# Patient Record
Sex: Female | Born: 1990 | Race: White | Hispanic: No | Marital: Single | Smoking: Never smoker
Health system: Southern US, Community
[De-identification: ages and names within clinical notes are randomized; demographics above are authoritative.]

## PROBLEM LIST (undated history)

## (undated) ENCOUNTER — Inpatient Hospital Stay (HOSPITAL_COMMUNITY): Payer: Self-pay

## (undated) DIAGNOSIS — N809 Endometriosis, unspecified: Secondary | ICD-10-CM

## (undated) DIAGNOSIS — K589 Irritable bowel syndrome without diarrhea: Secondary | ICD-10-CM

## (undated) DIAGNOSIS — J45909 Unspecified asthma, uncomplicated: Secondary | ICD-10-CM

## (undated) HISTORY — PX: FIRST RIB REMOVAL: SHX642

## (undated) HISTORY — PX: TONSILLECTOMY: SUR1361

## (undated) HISTORY — PX: OVARIAN CYST REMOVAL: SHX89

---
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
## 2012-04-08 ENCOUNTER — Emergency Department: Payer: Self-pay | Admitting: Emergency Medicine

## 2012-04-08 LAB — BASIC METABOLIC PANEL
Anion Gap: 9 (ref 7–16)
BUN: 8 mg/dL — ABNORMAL LOW (ref 9–21)
Co2: 27 mmol/L — ABNORMAL HIGH (ref 16–25)
EGFR (Non-African Amer.): >60
Potassium: 3.5 mmol/L (ref 3.3–4.7)

## 2012-04-08 LAB — URINALYSIS, COMPLETE
Bilirubin,UR: NEGATIVE
Blood: NEGATIVE
Glucose,UR: NEGATIVE mg/dL
Ketone: NEGATIVE
Nitrite: NEGATIVE
Ph: 6
Protein: NEGATIVE
RBC,UR: 2 /HPF
Specific Gravity: 1.02
Squamous Epithelial: 21
WBC UR: 25 /HPF

## 2012-04-08 LAB — CBC
HCT: 37.9 %
HGB: 12.8 g/dL
MCH: 30.7 pg
MCHC: 33.8 g/dL
MCV: 91 fL
Platelet: 181 x10 3/mm 3
RBC: 4.17 X10 6/mm 3
RDW: 12.6 %
WBC: 9 x10 3/mm 3

## 2012-04-08 LAB — HCG, QUANTITATIVE, PREGNANCY: Beta Hcg, Quant.: 69876 m[IU]/mL — ABNORMAL HIGH

## 2012-06-12 ENCOUNTER — Encounter (HOSPITAL_COMMUNITY): Payer: Self-pay | Admitting: *Deleted

## 2012-06-12 ENCOUNTER — Inpatient Hospital Stay (HOSPITAL_COMMUNITY)
Admission: AD | Admit: 2012-06-12 | Discharge: 2012-06-12 | Disposition: A | Discharge status: Home / Self Care | Payer: Medicaid Other | Source: Ambulatory Visit | Attending: Obstetrics | Admitting: Obstetrics

## 2012-06-12 DIAGNOSIS — R300 Dysuria: Secondary | ICD-10-CM | POA: Insufficient documentation

## 2012-06-12 DIAGNOSIS — N949 Unspecified condition associated with female genital organs and menstrual cycle: Secondary | ICD-10-CM

## 2012-06-12 DIAGNOSIS — H9209 Otalgia, unspecified ear: Secondary | ICD-10-CM | POA: Insufficient documentation

## 2012-06-12 DIAGNOSIS — R109 Unspecified abdominal pain: Secondary | ICD-10-CM | POA: Insufficient documentation

## 2012-06-12 DIAGNOSIS — R509 Fever, unspecified: Secondary | ICD-10-CM | POA: Insufficient documentation

## 2012-06-12 DIAGNOSIS — O99891 Other specified diseases and conditions complicating pregnancy: Secondary | ICD-10-CM | POA: Insufficient documentation

## 2012-06-12 LAB — URINALYSIS, ROUTINE W REFLEX MICROSCOPIC
Bilirubin Urine: NEGATIVE
Hgb urine dipstick: NEGATIVE
Ketones, ur: NEGATIVE mg/dL
Nitrite: NEGATIVE
Urobilinogen, UA: 0.2 mg/dL (ref 0.0–1.0)

## 2012-06-12 LAB — WET PREP, GENITAL

## 2012-06-12 NOTE — MAU Note (Signed)
Pt reports "i haven't felt good for the last few days", earache, headache, fever at times. Noticed today her feet are really swollen, b/p at home 160/80. Reports 4 contractions today.

## 2012-06-12 NOTE — MAU Provider Note (Signed)
History     CSN: [id number]  Arrival date & time 06/12/12  1947   None     Chief Complaint  Patient presents with  . Hypertension    HPI Gloria Valdez is a 21 y.o. female @ [redacted]w[redacted]d gestation who presents to MAU for lower abdominal cramping that started last night. Today felt like a couple contractions. Had fever earlier today 101.3 and felt nauseated and had low back pain. Frequent urination with pressure feeling. The patient's mother is concerned that the patient may have PIH because she thinks her feet have had some swelling. The history was provided by the patient and her mother.  History reviewed. No pertinent past medical history.  Past Surgical History  Procedure Date  . Tonsillectomy     History reviewed. No pertinent family history.  History  Substance Use Topics  . Smoking status: Never Smoker   . Smokeless tobacco: Not on file  . Alcohol Use: No    OB History    Grav Para Term Preterm Abortions TAB SAB Ect Mult Living   1               Review of Systems  Constitutional: Positive for fever and appetite change. Negative for chills, diaphoresis and fatigue.  HENT: Positive for ear pain. Negative for congestion, sore throat, facial swelling, neck pain, neck stiffness, dental problem and sinus pressure.   Eyes: Negative for photophobia, pain, discharge and visual disturbance.  Respiratory: Negative for cough, chest tightness and wheezing.   Cardiovascular: Negative for chest pain and palpitations.  Gastrointestinal: Positive for nausea, abdominal pain and constipation. Negative for vomiting, diarrhea and abdominal distention.  Genitourinary: Positive for vaginal discharge. Negative for dysuria, frequency, flank pain, vaginal bleeding and difficulty urinating.  Musculoskeletal: Positive for back pain. Negative for myalgias and gait problem.  Skin: Negative for color change and rash.  Neurological: Positive for light-headedness and headaches. Negative for  dizziness, speech difficulty, weakness and numbness.  Psychiatric/Behavioral: Negative for confusion and agitation. The patient is not nervous/anxious.     Allergies  Cinnamon  Home Medications  No current outpatient prescriptions on file.  BP 109/73  Pulse 76  Temp 99.4 F (37.4 C) (Oral)  Resp 20  Ht 5' 9.5" (1.765 m)  Wt 167 lb (75.751 kg)  BMI 24.31 kg/m2  SpO2 100%  Physical Exam  Nursing note and vitals reviewed. Constitutional: She is oriented to person, place, and time. Vital signs are normal. She appears well-developed and well-nourished. No distress.  HENT:  Head: Normocephalic.  Right Ear: External ear normal.  Left Ear: External ear normal.  Mouth/Throat: Oropharynx is clear and moist.       TM's normal bilateral.  Eyes: EOM are normal.  Neck: Neck supple.  Cardiovascular: Normal rate and regular rhythm.   Pulmonary/Chest: Effort normal and breath sounds normal.  Abdominal: Soft. Bowel sounds are normal. There is tenderness in the suprapubic area. There is no rigidity, no rebound, no guarding and no CVA tenderness.       Gravid at [redacted] weeks gestation.  Genitourinary:       External genitalia without lesions. White discharge vaginal vault. Cervix closed, thick, high. Uterus consistent with dates.  Musculoskeletal: Normal range of motion. She exhibits no edema.  Neurological: She is alert and oriented to person, place, and time. She has normal strength. No cranial nerve deficit.  Skin: Skin is warm and dry.  Psychiatric: She has a normal mood and affect. Her behavior is normal. Judgment  and thought content normal.   Results for orders placed during the hospital encounter of 06/12/12 (from the past 24 hour(s))  URINALYSIS, ROUTINE W REFLEX MICROSCOPIC     Status: Normal   Collection Time   06/12/12  8:05 PM      Component Value Range   Color, Urine YELLOW  YELLOW   APPearance CLEAR  CLEAR   Specific Gravity, Urine 1.015  1.005 - 1.030   pH 6.0  5.0 - 8.0    Glucose, UA NEGATIVE  NEGATIVE mg/dL   Hgb urine dipstick NEGATIVE  NEGATIVE   Bilirubin Urine NEGATIVE  NEGATIVE   Ketones, ur NEGATIVE  NEGATIVE mg/dL   Protein, ur NEGATIVE  NEGATIVE mg/dL   Urobilinogen, UA 0.2  0.0 - 1.0 mg/dL   Nitrite NEGATIVE  NEGATIVE   Leukocytes, UA NEGATIVE  NEGATIVE   EFM; Base line FH 130 bpm No contractions, no decelerations, normal for gestational age ED Course  Procedures  Assessment: Round ligament pain   Fever by history ? Viral illness   Pressure with urination   Ear pain ? TMJ  Plan:  Tylenol prn discomfort or fever   Urine sent for culture, GC, Chlamydia cultures sent   Call the office in am for follow up    I discussed with the patient and her mother that PIH is unlikely given her blood pressure of 109/64, no noted edema in the lower extremities and  no headache. Discussed urine results and culture sent. Discussed need for follow up in the office for further evaluation. Patient voices understanding.  MDM

## 2012-06-12 NOTE — Discharge Instructions (Signed)
Take tylenol as needed for discomfort. Call the office in the morning to schedule follow up. Return here as needed.

## 2012-06-12 NOTE — MAU Note (Signed)
Pt's mom states that she took pt's blood pressure and it was elevated. Pt states that she notice swelling in her feet.

## 2012-06-22 ENCOUNTER — Inpatient Hospital Stay (HOSPITAL_COMMUNITY)
Admission: AD | Admit: 2012-06-22 | Discharge: 2012-06-22 | Disposition: A | Discharge status: Home / Self Care | Payer: Medicaid Other | Source: Ambulatory Visit | Attending: Obstetrics & Gynecology | Admitting: Obstetrics & Gynecology

## 2012-06-22 ENCOUNTER — Encounter (HOSPITAL_COMMUNITY): Payer: Self-pay | Admitting: *Deleted

## 2012-06-22 DIAGNOSIS — O99891 Other specified diseases and conditions complicating pregnancy: Secondary | ICD-10-CM | POA: Insufficient documentation

## 2012-06-22 DIAGNOSIS — L259 Unspecified contact dermatitis, unspecified cause: Secondary | ICD-10-CM

## 2012-06-22 DIAGNOSIS — R21 Rash and other nonspecific skin eruption: Secondary | ICD-10-CM | POA: Insufficient documentation

## 2012-06-22 DIAGNOSIS — L239 Allergic contact dermatitis, unspecified cause: Secondary | ICD-10-CM

## 2012-06-22 DIAGNOSIS — Z3490 Encounter for supervision of normal pregnancy, unspecified, unspecified trimester: Secondary | ICD-10-CM

## 2012-06-22 MED ORDER — FLUTICASONE PROPIONATE 0.05 % EX CREA: TOPICAL_CREAM | Freq: Two times a day (BID) | CUTANEOUS | Status: DC

## 2012-06-22 NOTE — Discharge Instructions (Signed)

## 2012-06-22 NOTE — MAU Provider Note (Signed)
  History     CSN: [id number]  Arrival date and time: 06/22/12 1615   First Provider Initiated Contact with Patient 06/22/12 1938      Chief Complaint  Patient presents with  . Rash   Patient is a 21 y.o. female presenting with rash.  Rash  Associated symptoms include itching.  Gloria Valdez is a 21 y.o. G1P0 at [redacted]w[redacted]d. She had a rash on bil hips, saw Dr Clearance Coots, dx as fungus. Is using Lotrimin cream but the rash is getting worse. It itches all the time, is spreading. No new contacts with clothing, soaps, have washed all clothing again.     Past Medical History  Diagnosis Date  . No pertinent past medical history     Past Surgical History  Procedure Date  . Tonsillectomy   . First rib removal     History reviewed. No pertinent family history.  History  Substance Use Topics  . Smoking status: Never Smoker   . Smokeless tobacco: Not on file  . Alcohol Use: No    Allergies:  Allergies  Allergen Reactions  . Cinnamon Hives, Itching and Swelling    Prescriptions prior to admission  Medication Sig Dispense Refill  . acetaminophen (TYLENOL) 325 MG tablet Take 325 mg by mouth every 6 (six) hours as needed. For headache or pain      . clotrimazole (LOTRIMIN) 1 % cream Apply 1 application topically 2 (two) times daily.      . Prenatal Vit-Fe Fumarate-FA (PRENATAL MULTIVITAMIN) TABS Take 1 tablet by mouth at bedtime.        Review of Systems  Constitutional: Negative for fever and chills.  Gastrointestinal: Negative for abdominal pain.  Genitourinary: Negative for dysuria, urgency and frequency.       Denies bleeding, leaking. Good fetal activity  Skin: Positive for itching and rash.   Physical Exam   Blood pressure 111/59, pulse 62, temperature 98.8 F (37.1 C), temperature source Oral, resp. rate 18, height 5\' 9"  (1.753 m), weight 168 lb 9.6 oz (76.476 kg).  Physical Exam  Constitutional: She is oriented to person, place, and time. She appears  well-developed and well-nourished. No distress.  Musculoskeletal: Normal range of motion.  Neurological: She is alert and oriented to person, place, and time.  Skin:       bil hips erythematous raised, warm, irreg borders with scattered satellite spots on abd.  Psychiatric: She has a normal mood and affect. Her behavior is normal.    MAU Course  Procedures  MDM allergic dermatitis Evaluated with Kerrie Buffalo, NP  Assessment and Plan  Rx Cutivate 3-4x/day Benadryl po, cool soaks Call the office for f/u if no improvement  [place] Korver M. 06/22/2012, 7:49 PM

## 2012-06-22 NOTE — MAU Note (Signed)
Pt rpeorts she had this rash that started on her left flank on Wed. Went to Doctor and was given a crea. Rash has spreat to right flank and some on her abd as well. Rash is very itchy and burning.

## 2012-07-02 ENCOUNTER — Other Ambulatory Visit: Payer: Self-pay | Admitting: Obstetrics & Gynecology

## 2012-07-02 LAB — OB RESULTS CONSOLE HIV ANTIBODY (ROUTINE TESTING): HIV: NONREACTIVE

## 2012-07-02 LAB — OB RESULTS CONSOLE GC/CHLAMYDIA: Gonorrhea: NEGATIVE

## 2012-07-02 LAB — OB RESULTS CONSOLE ABO/RH: RH Type: NEGATIVE

## 2012-07-02 LAB — OB RESULTS CONSOLE RPR: RPR: NONREACTIVE

## 2012-07-17 ENCOUNTER — Inpatient Hospital Stay (HOSPITAL_COMMUNITY)
Admission: AD | Admit: 2012-07-17 | Discharge: 2012-07-17 | Disposition: A | Discharge status: Home / Self Care | Payer: Medicaid Other | Source: Ambulatory Visit | Attending: Obstetrics | Admitting: Obstetrics

## 2012-07-17 DIAGNOSIS — Z3480 Encounter for supervision of other normal pregnancy, unspecified trimester: Secondary | ICD-10-CM | POA: Insufficient documentation

## 2012-07-17 DIAGNOSIS — Z298 Encounter for other specified prophylactic measures: Secondary | ICD-10-CM | POA: Insufficient documentation

## 2012-07-17 DIAGNOSIS — Z2989 Encounter for other specified prophylactic measures: Secondary | ICD-10-CM | POA: Insufficient documentation

## 2012-07-17 LAB — ABO/RH: ABO/RH(D): A NEG

## 2012-07-17 MED ORDER — RHO D IMMUNE GLOBULIN 1500 UNIT/2ML IJ SOLN
300.0000 ug | Freq: Once | INTRAMUSCULAR | Status: AC
  Administered 2012-07-17: 300 ug via INTRAMUSCULAR
  Filled 2012-07-17: qty 2

## 2012-07-17 NOTE — MAU Note (Signed)
Rhophylac information booklet given to the patient while in the lobby. Explained the 1 1/2 hours required to process this order.Patient verbalizes understanding of instructions.

## 2012-07-18 LAB — RH IG WORKUP (INCLUDES ABO/RH)
Antibody Screen: NEGATIVE
Fetal Screen: NEGATIVE
Gestational Age(Wks): 28

## 2012-08-16 ENCOUNTER — Encounter (HOSPITAL_COMMUNITY): Payer: Self-pay | Admitting: *Deleted

## 2012-08-16 ENCOUNTER — Inpatient Hospital Stay (HOSPITAL_COMMUNITY)
Admission: AD | Admit: 2012-08-16 | Discharge: 2012-08-17 | Disposition: A | Discharge status: Home / Self Care | Payer: Medicaid Other | Source: Ambulatory Visit | Attending: Obstetrics | Admitting: Obstetrics

## 2012-08-16 DIAGNOSIS — M549 Dorsalgia, unspecified: Secondary | ICD-10-CM | POA: Insufficient documentation

## 2012-08-16 DIAGNOSIS — O26859 Spotting complicating pregnancy, unspecified trimester: Secondary | ICD-10-CM | POA: Insufficient documentation

## 2012-08-16 DIAGNOSIS — O99891 Other specified diseases and conditions complicating pregnancy: Secondary | ICD-10-CM | POA: Insufficient documentation

## 2012-08-16 DIAGNOSIS — R42 Dizziness and giddiness: Secondary | ICD-10-CM | POA: Insufficient documentation

## 2012-08-16 DIAGNOSIS — R109 Unspecified abdominal pain: Secondary | ICD-10-CM | POA: Insufficient documentation

## 2012-08-16 LAB — URINALYSIS, ROUTINE W REFLEX MICROSCOPIC
Glucose, UA: NEGATIVE mg/dL
Ketones, ur: NEGATIVE mg/dL
Leukocytes, UA: NEGATIVE
Nitrite: NEGATIVE
Protein, ur: NEGATIVE mg/dL
pH: 7 (ref 5.0–8.0)

## 2012-08-16 LAB — WET PREP, GENITAL: Yeast Wet Prep HPF POC: NONE SEEN

## 2012-08-16 MED ORDER — ACETAMINOPHEN 325 MG PO TABS
650.0000 mg | ORAL_TABLET | Freq: Four times a day (QID) | ORAL | Status: DC | PRN
  Administered 2012-08-16: 650 mg via ORAL
  Filled 2012-08-16: qty 2

## 2012-08-16 NOTE — MAU Note (Signed)
About 2030 started having contractions about 10-5mins apart. Saw pink on tissue one time when wiped. Some back pain and abdominal cramping. Currently on PCN for strep throat since Monday.

## 2012-08-16 NOTE — MAU Provider Note (Signed)
Gloria Valdez is a 21 y.o. female presenting for eval of multiple complaints including spotting with wiping today, short of breath, dizzy, back pain, and cramping. Denies leaking of fluid. Denies dysuria.  Has multiple family members in room. History OB History    Grav Para Term Preterm Abortions TAB SAB Ect Mult Living   1              Past Medical History  Diagnosis Date  . No pertinent past medical history    Past Surgical History  Procedure Date  . Tonsillectomy   . First rib removal    Family History: family history is negative for Other. Social History:  reports that she has never smoked. She does not have any smokeless tobacco history on file. She reports that she does not drink alcohol or use illicit drugs.    ROS  Dilation: Closed Exam by:: Philipp Deputy CNM Blood pressure 119/79, pulse 95, temperature 98.1 F (36.7 C), resp. rate 20, height 5\' 9"  (1.753 m), weight 79.379 kg (175 lb). SaO2 100% Maternal Exam:  Uterine Assessment: One ctx in 1 hr of monitoring  Cervix: Very post- pt needed to sit on fists- C/L  Fetal Exam Fetal Monitor Review: Baseline rate: 145.  Variability: moderate (6-25 bpm).   Pattern: accelerations present and no decelerations.    Fetal State Assessment: Category I - tracings are normal.     Physical Exam  Constitutional: She is oriented to person, place, and time. She appears well-developed and well-nourished.  HENT:  Head: Normocephalic.  Cardiovascular: Normal rate.   Respiratory: Effort normal.  Genitourinary: Vagina normal.       sm white d/c seen with SE; no blood/pink/brown visualized  Musculoskeletal: Normal range of motion.  Neurological: She is alert and oriented to person, place, and time.  Skin: Skin is warm and dry.  Psychiatric: She has a normal mood and affect. Her behavior is normal. Thought content normal.    Urinalysis    Component Value Date/Time   COLORURINE YELLOW 08/16/2012 2233   APPEARANCEUR CLEAR  08/16/2012 2233   LABSPEC 1.010 08/16/2012 2233   PHURINE 7.0 08/16/2012 2233   GLUCOSEU NEGATIVE 08/16/2012 2233   HGBUR NEGATIVE 08/16/2012 2233   BILIRUBINUR NEGATIVE 08/16/2012 2233   KETONESUR NEGATIVE 08/16/2012 2233   PROTEINUR NEGATIVE 08/16/2012 2233   UROBILINOGEN 0.2 08/16/2012 2233   NITRITE NEGATIVE 08/16/2012 2233   LEUKOCYTESUR NEGATIVE 08/16/2012 2233   Microscopic wet-mount exam shows negative for pathogens, normal epithelial cells, white blood cells.  Prenatal labs: ABO, Rh: --/--/A NEG, A NEG (07/25 1000) Antibody: NEG (07/25 1000) Rubella:   RPR:    HBsAg:    HIV:    GBS:     Assessment/Plan: IUP at 32.2 Discomforts of late preg  D/C home with preterm labor precautions F/U as scheduled or sooner prn    Cam Hai 08/16/2012, 11:43 PM

## 2012-08-16 NOTE — Progress Notes (Signed)
Wet prep obtained.   

## 2012-08-17 DIAGNOSIS — R42 Dizziness and giddiness: Secondary | ICD-10-CM

## 2012-08-17 DIAGNOSIS — R109 Unspecified abdominal pain: Secondary | ICD-10-CM

## 2012-08-17 DIAGNOSIS — M549 Dorsalgia, unspecified: Secondary | ICD-10-CM

## 2012-08-17 NOTE — Progress Notes (Signed)
Philipp Deputy CNM in to see pt and efm d/ced

## 2012-08-17 NOTE — Progress Notes (Signed)
Written and verbal d/c instructions given and understanding voiced. 

## 2012-08-25 ENCOUNTER — Inpatient Hospital Stay (HOSPITAL_COMMUNITY)
Admit: 2012-08-25 | Discharge: 2012-08-25 | Disposition: A | Discharge status: Home / Self Care | Payer: Medicaid Other | Source: Ambulatory Visit | Attending: Obstetrics | Admitting: Obstetrics

## 2012-08-25 ENCOUNTER — Encounter (HOSPITAL_COMMUNITY): Payer: Self-pay

## 2012-08-25 DIAGNOSIS — R32 Unspecified urinary incontinence: Secondary | ICD-10-CM | POA: Insufficient documentation

## 2012-08-25 DIAGNOSIS — O479 False labor, unspecified: Secondary | ICD-10-CM

## 2012-08-25 DIAGNOSIS — O4700 False labor before 37 completed weeks of gestation, unspecified trimester: Secondary | ICD-10-CM | POA: Insufficient documentation

## 2012-08-25 DIAGNOSIS — O99891 Other specified diseases and conditions complicating pregnancy: Secondary | ICD-10-CM | POA: Insufficient documentation

## 2012-08-25 HISTORY — DX: Unspecified asthma, uncomplicated: J45.909

## 2012-08-25 NOTE — MAU Note (Signed)
Initial gush of white/clear fluid at 2021 tonight. Has been contracting some since then. Denies vaginal bleeding. Positive fetal movement.

## 2012-08-25 NOTE — MAU Provider Note (Signed)
History     CSN: [id number]  Arrival date and time: 08/25/12 2135   First Provider Initiated Contact with Patient 08/25/12 2219      Chief Complaint  Patient presents with  . Rupture of Membranes  . Contractions   HPI Gloria Valdez is a 21 y.o. female @ [redacted]w[redacted]d gestation who presents to MAU for rupture of membranes. She reports that she was helping her mother move some heavy boxes when she felt a gush of fluid and started having contractions. The fluid was clear. The patient states she tried to check her own cervix after the fluid. The history was provided by the patient.  OB History    Grav Para Term Preterm Abortions TAB SAB Ect Mult Living   3 0 0 0 2 0 2 0 0 0       Past Medical History  Diagnosis Date  . Asthma     Past Surgical History  Procedure Date  . Tonsillectomy   . First rib removal     Family History  Problem Relation Age of Onset  . Other Neg Hx     History  Substance Use Topics  . Smoking status: Never Smoker   . Smokeless tobacco: Not on file  . Alcohol Use: No    Allergies:  Allergies  Allergen Reactions  . Bee Venom Anaphylaxis  . Cinnamon Hives, Itching and Swelling  . Demerol (Meperidine) Itching  . Phenergan (Promethazine Hcl) Nausea Only  . Strawberry Itching and Swelling    Prescriptions prior to admission  Medication Sig Dispense Refill  . acetaminophen (TYLENOL) 325 MG tablet Take 325 mg by mouth every 6 (six) hours as needed. For headache or pain      . Prenatal Vit-Fe Fumarate-FA (PRENATAL MULTIVITAMIN) TABS Take 1 tablet by mouth at bedtime.        Review of Systems  Constitutional: Negative for fever, chills and weight loss.  HENT: Negative for ear pain, nosebleeds, congestion, sore throat and neck pain.   Eyes: Negative for blurred vision, double vision, photophobia and pain.  Respiratory: Negative for cough, shortness of breath and wheezing.   Cardiovascular: Negative for chest pain, palpitations and leg swelling.    Gastrointestinal: Positive for abdominal pain. Negative for heartburn, nausea, vomiting, diarrhea and constipation.  Genitourinary: Negative for dysuria, urgency and frequency.       Leaking fluid from vagina  Musculoskeletal: Negative for myalgias and back pain.  Skin: Negative for itching and rash.  Neurological: Negative for dizziness, sensory change, speech change, seizures, weakness and headaches.  Endo/Heme/Allergies: Does not bruise/bleed easily.  Psychiatric/Behavioral: Negative for depression. The patient is not nervous/anxious.    Physical Exam   Blood pressure 127/75, pulse 74, temperature 98.3 F (36.8 C), temperature source Oral, resp. rate 18, height 5' 9.02" (1.753 m), weight 179 lb 3.2 oz (81.285 kg).  Physical Exam  Nursing note and vitals reviewed. Constitutional: She is oriented to person, place, and time. She appears well-developed and well-nourished.  HENT:  Head: Normocephalic.  Cardiovascular: Normal rate.   Respiratory: Effort normal.  GI: Soft. There is tenderness. There is no rebound and no guarding.       Tenderness is mild in lower abdomen. Positive FHT.  Genitourinary:       External genitalia without lesions. White discharge vaginal vault. No pooling noted. Cervix closed, thick, posterior. Uterus consistent with dates.  Musculoskeletal: Normal range of motion.  Neurological: She is alert and oriented to person, place, and time.  Skin: Skin  is warm and dry.  Psychiatric: She has a normal mood and affect. Her behavior is normal. Judgment and thought content normal.   Results for orders placed during the hospital encounter of 08/25/12 (from the past 24 hour(s))  AMNISURE RUPTURE OF MEMBRANE (ROM)     Status: Normal   Collection Time   08/25/12 10:36 PM      Component Value Range   Amnisure ROM NEGATIVE    POCT FERN TEST     Status: Normal   Collection Time   08/25/12 10:38 PM      Component Value Range   Fern Test Negative     EFM: Baseline 150,  moderate variability, no decelerations, irregular contractions. Procedures  Discussed with DR. Clearance Coots and will d/c patient home to follow up in the office.   Assessment: Urinary incontinent   False labor  Plan:  Home, rest, no heavy lifting   Follow up in the office tomorrow as scheduled   Return  Here as needed Discussed with the patient and all questioned fully answered. She will follow up in the office or return here if any problems arise. Medication List  As of 08/26/2012  1:21 AM   CONTINUE taking these medications         acetaminophen 325 MG tablet   Commonly known as: TYLENOL      prenatal multivitamin Tabs           Follow-up Information    Follow up with Brock Bad, MD.   Contact information:   [address] [place] [address] [phone]         Kerrie Buffalo, RN, FNP, [facility] 08/25/2012, 10:40 PM

## 2012-08-25 NOTE — MAU Note (Signed)
Outside having cramping, back pain,  Then noticed fluid leaking.

## 2012-09-09 LAB — OB RESULTS CONSOLE GBS: GBS: NEGATIVE

## 2012-09-18 ENCOUNTER — Encounter (HOSPITAL_COMMUNITY): Payer: Self-pay | Admitting: *Deleted

## 2012-09-18 ENCOUNTER — Inpatient Hospital Stay (HOSPITAL_COMMUNITY)
Admission: AD | Admit: 2012-09-18 | Discharge: 2012-09-19 | Disposition: A | Discharge status: Home / Self Care | Payer: Medicaid Other | Source: Ambulatory Visit | Attending: Obstetrics | Admitting: Obstetrics

## 2012-09-18 DIAGNOSIS — B3731 Acute candidiasis of vulva and vagina: Secondary | ICD-10-CM | POA: Insufficient documentation

## 2012-09-18 DIAGNOSIS — O2390 Unspecified genitourinary tract infection in pregnancy, unspecified trimester: Secondary | ICD-10-CM | POA: Insufficient documentation

## 2012-09-18 DIAGNOSIS — A084 Viral intestinal infection, unspecified: Secondary | ICD-10-CM

## 2012-09-18 DIAGNOSIS — A088 Other specified intestinal infections: Secondary | ICD-10-CM | POA: Insufficient documentation

## 2012-09-18 DIAGNOSIS — B373 Candidiasis of vulva and vagina: Secondary | ICD-10-CM | POA: Insufficient documentation

## 2012-09-18 DIAGNOSIS — O99891 Other specified diseases and conditions complicating pregnancy: Secondary | ICD-10-CM | POA: Insufficient documentation

## 2012-09-18 DIAGNOSIS — O212 Late vomiting of pregnancy: Secondary | ICD-10-CM | POA: Insufficient documentation

## 2012-09-18 LAB — URINE MICROSCOPIC-ADD ON

## 2012-09-18 LAB — URINALYSIS, ROUTINE W REFLEX MICROSCOPIC
Glucose, UA: NEGATIVE mg/dL
Hgb urine dipstick: NEGATIVE
Protein, ur: NEGATIVE mg/dL
pH: 7 (ref 5.0–8.0)

## 2012-09-18 MED ORDER — ONDANSETRON HCL 8 MG PO TABS: 8.0000 mg | ORAL_TABLET | Freq: Three times a day (TID) | ORAL | Status: DC | PRN

## 2012-09-18 MED ORDER — ONDANSETRON HCL 4 MG PO TABS
8.0000 mg | ORAL_TABLET | ORAL | Status: AC
  Administered 2012-09-18: 8 mg via ORAL
  Filled 2012-09-18 (×2): qty 1

## 2012-09-18 MED ORDER — FLUCONAZOLE 150 MG PO TABS
150.0000 mg | ORAL_TABLET | ORAL | Status: AC
  Administered 2012-09-18: 150 mg via ORAL
  Filled 2012-09-18: qty 1

## 2012-09-18 MED ORDER — FLUCONAZOLE 150 MG PO TABS: ORAL_TABLET | ORAL | Status: DC

## 2012-09-18 NOTE — MAU Note (Signed)
Pt reports vomiting (liquid and food) x 1 week. Diarrhea. Thinks she may have had a fever. Headache, dizziness. Difficulty breathing "at times" and "some mild angina".

## 2012-09-18 NOTE — MAU Note (Signed)
Pt states she has been having nausea and vomiting as well as diarrhea since Thursday 09/11/2012. Pt complaints of not being able "keep anything down " pt states she is having hot and cold flashes as well. Pt states she has had a bowel movement today that contained mucous.

## 2012-09-18 NOTE — Progress Notes (Signed)
Last episode of diarrhea yesterday at 1600

## 2012-09-18 NOTE — MAU Provider Note (Signed)
Chief Complaint:  Nausea and Emesis   None     HPI: Gloria Valdez is a 21 y.o. G3P0020 at 72w0dwho presents to maternity admissions reporting n/v and diarrhea x1 week.  No diarrhea today but pt vomited x3.  She ate some cookie dough the day her diarrhea started and is concerned about food poisoning.  While in MAU, she reports some leakage of fluid when she went to the bathroom.  She also reports some vaginal itching in the last 2-3 days.  She reports good fetal movement, vaginal bleeding, vaginal itching/burning, urinary symptoms, h/a, dizziness, or fever/chills.     Past Medical History: Past Medical History  Diagnosis Date  . Asthma     Past obstetric history:  Past Surgical History: Past Surgical History  Procedure Date  . Tonsillectomy   . First rib removal     Family History: Family History  Problem Relation Age of Onset  . Other Neg Hx   . Diabetes Mother   . Hypertension Father   . Asthma Father     Social History: History  Substance Use Topics  . Smoking status: Never Smoker   . Smokeless tobacco: Not on file  . Alcohol Use: No    Allergies:  Allergies  Allergen Reactions  . Bee Venom Anaphylaxis  . Cinnamon Hives, Itching and Swelling  . Demerol (Meperidine) Itching  . Phenergan (Promethazine Hcl) Nausea Only  . Strawberry Itching and Swelling    Meds:  Prescriptions prior to admission  Medication Sig Dispense Refill  . acetaminophen (TYLENOL) 325 MG tablet Take 325 mg by mouth every 6 (six) hours as needed. For headache or pain      . Prenatal Vit-Fe Fumarate-FA (PRENATAL MULTIVITAMIN) TABS Take 1 tablet by mouth at bedtime.      Marland Kitchen DISCONTD: Ondansetron (ZOFRAN ODT PO) Take 1 tablet by mouth every 8 (eight) hours as needed. For nausea        ROS: Pertinent findings in history of present illness.  Physical Exam  Blood pressure 138/85, pulse 77, temperature 98.3 F (36.8 C), temperature source Oral, resp. rate 18, height 5\' 9"  (1.753 m),  weight 81.194 kg (179 lb), SpO2 100.00%. GENERAL: Well-developed, well-nourished female in no acute distress.  HEENT: normocephalic HEART: normal rate RESP: normal effort ABDOMEN: Soft, non-tender, gravid appropriate for gestational age EXTREMITIES: Nontender, no edema NEURO: alert and oriented Pelvic exam: Cervix pink, visually closed, without lesion, large amount white clumpy vaginal discharge, vaginal walls and external genitalia normal  Cervix FT/50%/-3, posterior, soft    FHT:  Baseline 135 , moderate variability, accelerations present, no decelerations Contractions: irregular, 2-10 minutes   Labs: Results for orders placed during the hospital encounter of 09/18/12 (from the past 24 hour(s))  URINALYSIS, ROUTINE W REFLEX MICROSCOPIC     Status: Abnormal   Collection Time   09/18/12  8:10 PM      Component Value Range   Color, Urine YELLOW  YELLOW   APPearance HAZY (*) CLEAR   Specific Gravity, Urine <1.005 (*) 1.005 - 1.030   pH 7.0  5.0 - 8.0   Glucose, UA NEGATIVE  NEGATIVE mg/dL   Hgb urine dipstick NEGATIVE  NEGATIVE   Bilirubin Urine NEGATIVE  NEGATIVE   Ketones, ur NEGATIVE  NEGATIVE mg/dL   Protein, ur NEGATIVE  NEGATIVE mg/dL   Urobilinogen, UA 1.0  0.0 - 1.0 mg/dL   Nitrite NEGATIVE  NEGATIVE   Leukocytes, UA SMALL (*) NEGATIVE  URINE MICROSCOPIC-ADD ON  Status: Abnormal   Collection Time   09/18/12  8:10 PM      Component Value Range   Squamous Epithelial / LPF FEW (*) RARE   WBC, UA 0-2  <3 WBC/hpf   Bacteria, UA FEW (*) RARE    Assessment: 1. Vaginal candida   2. Viral gastroenteritis     Plan: Diflucan 150 mg x1 dose now, 1 dose prescribed for 2 days from now Discharge home Labor precautions and fetal kick counts Urine sent for culture Zofran 8 mg PO tabs 1 tab Q8 hours PRN F/U with Dr Clearance Coots Return to MAU as needed  Follow-up Information    Follow up with HARPER,CHARLES A, MD. (Return to MAU as needed.)    Contact information:   [address] [place] [address] [phone]           Medication List     As of 09/18/2012 11:23 PM    STOP taking these medications         ZOFRAN ODT PO      TAKE these medications         acetaminophen 325 MG tablet   Commonly known as: TYLENOL   Take 325 mg by mouth every 6 (six) hours as needed. For headache or pain      fluconazole 150 MG tablet   Commonly known as: DIFLUCAN   Take one tablet by mouth on Sunday, 2 days after your dose in MAU.      ondansetron 8 MG tablet   Commonly known as: ZOFRAN   Take 1 tablet (8 mg total) by mouth every 8 (eight) hours as needed for nausea.      prenatal multivitamin Tabs   Take 1 tablet by mouth at bedtime.        Sharen Counter Certified Nurse-Midwife 09/18/2012 11:23 PM

## 2012-09-21 LAB — URINE CULTURE: Colony Count: 45000

## 2012-09-28 ENCOUNTER — Inpatient Hospital Stay (HOSPITAL_COMMUNITY): Payer: Medicaid Other

## 2012-09-28 ENCOUNTER — Encounter (HOSPITAL_COMMUNITY): Payer: Self-pay | Admitting: Obstetrics and Gynecology

## 2012-09-28 ENCOUNTER — Inpatient Hospital Stay (HOSPITAL_COMMUNITY)
Admission: AD | Admit: 2012-09-28 | Discharge: 2012-09-28 | Disposition: A | Discharge status: Home / Self Care | Payer: Medicaid Other | Source: Ambulatory Visit | Attending: Obstetrics & Gynecology | Admitting: Obstetrics & Gynecology

## 2012-09-28 DIAGNOSIS — O26899 Other specified pregnancy related conditions, unspecified trimester: Secondary | ICD-10-CM

## 2012-09-28 DIAGNOSIS — O1200 Gestational edema, unspecified trimester: Secondary | ICD-10-CM

## 2012-09-28 DIAGNOSIS — L299 Pruritus, unspecified: Secondary | ICD-10-CM | POA: Insufficient documentation

## 2012-09-28 DIAGNOSIS — O2686 Pruritic urticarial papules and plaques of pregnancy (PUPPP): Secondary | ICD-10-CM

## 2012-09-28 DIAGNOSIS — R0602 Shortness of breath: Secondary | ICD-10-CM

## 2012-09-28 DIAGNOSIS — L988 Other specified disorders of the skin and subcutaneous tissue: Secondary | ICD-10-CM

## 2012-09-28 DIAGNOSIS — O9989 Other specified diseases and conditions complicating pregnancy, childbirth and the puerperium: Secondary | ICD-10-CM | POA: Insufficient documentation

## 2012-09-28 LAB — COMPREHENSIVE METABOLIC PANEL
ALT: 6 U/L (ref 0–35)
AST: 9 U/L (ref 0–37)
Albumin: 2.5 g/dL — ABNORMAL LOW (ref 3.5–5.2)
Alkaline Phosphatase: 151 U/L — ABNORMAL HIGH (ref 39–117)
BUN: 14 mg/dL (ref 6–23)
CO2: 24 mEq/L (ref 19–32)
Calcium: 9.3 mg/dL (ref 8.4–10.5)
Chloride: 101 mEq/L (ref 96–112)
Creatinine, Ser: 0.82 mg/dL (ref 0.50–1.10)
GFR calc Af Amer: >90 mL/min (ref >90)
GFR calc non Af Amer: >90 mL/min (ref >90)
Glucose, Bld: 91 mg/dL (ref 70–99)
Potassium: 3.8 mEq/L (ref 3.5–5.1)
Sodium: 134 mEq/L — ABNORMAL LOW (ref 135–145)
Total Bilirubin: 0.8 mg/dL (ref 0.3–1.2)
Total Protein: 5.9 g/dL — ABNORMAL LOW (ref 6.0–8.3)

## 2012-09-28 LAB — CBC
HCT: 29.5 % — ABNORMAL LOW (ref 36.0–46.0)
Hemoglobin: 9.8 g/dL — ABNORMAL LOW (ref 12.0–15.0)
MCH: 28.2 pg (ref 26.0–34.0)
MCHC: 33.2 g/dL (ref 30.0–36.0)
MCV: 84.8 fL (ref 78.0–100.0)
Platelets: 203 10*3/uL (ref 150–400)
RBC: 3.48 MIL/uL — ABNORMAL LOW (ref 3.87–5.11)
RDW: 12.4 % (ref 11.5–15.5)
WBC: 10.7 10*3/uL — ABNORMAL HIGH (ref 4.0–10.5)

## 2012-09-28 LAB — URINE MICROSCOPIC-ADD ON

## 2012-09-28 LAB — URINALYSIS, ROUTINE W REFLEX MICROSCOPIC
Bilirubin Urine: NEGATIVE
Hgb urine dipstick: NEGATIVE
Nitrite: NEGATIVE
Protein, ur: NEGATIVE mg/dL
Specific Gravity, Urine: 1.015 (ref 1.005–1.030)
Urobilinogen, UA: 0.2 mg/dL (ref 0.0–1.0)

## 2012-09-28 MED ORDER — HYDROXYZINE PAMOATE 25 MG PO CAPS: 25.0000 mg | ORAL_CAPSULE | Freq: Three times a day (TID) | ORAL | Status: DC | PRN

## 2012-09-28 MED ORDER — HYDROXYZINE HCL 25 MG PO TABS
25.0000 mg | ORAL_TABLET | Freq: Once | ORAL | Status: AC
  Administered 2012-09-28: 25 mg via ORAL
  Filled 2012-09-28: qty 1

## 2012-09-28 NOTE — MAU Provider Note (Signed)
Chief Complaint:  Shortness of Breath, Pruritis and Edema   First Provider Initiated Contact with Patient 09/28/12 1812     HPI: Gloria Valdez is a 21 y.o. G3P0020 at 25w3dwho presents to maternity admissions reporting the following Sx since yesterday: 1. Swollen hands 2. Worsening of PUPPs rash.  3. SOB   Denies contractions, leakage of fluid, HA, epigastric pain, fever, cough, wheezing, cough, URI Sx, Allergy Sx or vaginal bleeding. Good fetal movement. Describes SOB as abrupt onset yesterday. Hx asthma as child. Does not feel the same. Taking Clotrimazole for PUPPs rash. Verified that pt did not mean to say Cortisone.   Past Medical History: Past Medical History  Diagnosis Date  . Asthma     Past obstetric history: OB History    Grav Para Term Preterm Abortions TAB SAB Ect Mult Living   3 0 0 0 2 0 2 0 0 0      # Outc Date GA Lbr Len/2nd Wgt Sex Del Anes PTL Lv   1 SAB            2 SAB            3 CUR               Past Surgical History: Past Surgical History  Procedure Date  . Tonsillectomy   . First rib removal     Family History: Family History  Problem Relation Age of Onset  . Other Neg Hx   . Diabetes Mother   . Hypertension Father   . Asthma Father     Social History: History  Substance Use Topics  . Smoking status: Never Smoker   . Smokeless tobacco: Not on file  . Alcohol Use: No    Allergies:  Allergies  Allergen Reactions  . Bee Venom Anaphylaxis  . Cinnamon Hives, Itching and Swelling  . Demerol (Meperidine) Itching  . Phenergan (Promethazine Hcl) Nausea Only  . Strawberry Itching and Swelling    Meds:  No prescriptions prior to admission    ROS: Pertinent findings in history of present illness.  Physical Exam  Blood pressure 114/87, pulse 96, temperature 97.8 F (36.6 C), temperature source Oral, resp. rate 18, height 5\' 9"  (1.753 m), weight 81.647 kg (180 lb), SpO2 99.00%. Patient Vitals for the past 24 hrs:  BP Temp Temp  src Pulse Resp SpO2 Height Weight  09/28/12 2131 114/87 mmHg - - 96  - - - -  09/28/12 1844 - - - 72  - 99 % - -  09/28/12 1839 - - - 77  - 100 % - -  09/28/12 1838 126/88 mmHg - - 71  - - - -  09/28/12 1834 - - - 73  - 99 % - -  09/28/12 1829 132/86 mmHg - - 82  - 100 % - -  09/28/12 1828 - - - 77  - - - -  09/28/12 1824 - - - 76  - 98 % - -  09/28/12 1820 - - - 90  - 100 % - -  09/28/12 1815 - - - 97  - 100 % - -  09/28/12 1810 - - - 103  - 99 % - -  09/28/12 1806 - - - 94  - 99 % - -  09/28/12 1801 - - - 87  - 99 % - -  09/28/12 1756 - - - 77  - 99 % - -  09/28/12 1751 - - - 97  - 100 % - -  09/28/12 1746 - - - 90  - 99 % - -  09/28/12 1745 131/89 mmHg - - 90  - - - -  09/28/12 1724 - - - - - 97 % - -  09/28/12 1723 136/86 mmHg 97.8 F (36.6 C) Oral 74  18  - 5\' 9"  (1.753 m) 81.647 kg (180 lb)    GENERAL: Well-developed, well-nourished female in no acute distress.  HEENT: normocephalic HEART: RRR, no Murmurs, rubs or gallops RESP: normal effort, lungs CTAB.  ABDOMEN: Soft, non-tender, gravid appropriate for gestational age EXTREMITIES: Nontender, tr pedal edema, 1+ edema in hands. NEURO: alert and oriented SPECULUM EXAM: deferred    FHT:  Baseline 140 , moderate variability, accelerations present, no decelerations Contractions: rare, mild   Labs: Results for orders placed during the hospital encounter of 09/28/12 (from the past 24 hour(s))  URINALYSIS, ROUTINE W REFLEX MICROSCOPIC     Status: Abnormal   Collection Time   09/28/12  5:23 PM      Component Value Range   Color, Urine YELLOW  YELLOW   APPearance CLEAR  CLEAR   Specific Gravity, Urine 1.015  1.005 - 1.030   pH 6.5  5.0 - 8.0   Glucose, UA NEGATIVE  NEGATIVE mg/dL   Hgb urine dipstick NEGATIVE  NEGATIVE   Bilirubin Urine NEGATIVE  NEGATIVE   Ketones, ur NEGATIVE  NEGATIVE mg/dL   Protein, ur NEGATIVE  NEGATIVE mg/dL   Urobilinogen, UA 0.2  0.0 - 1.0 mg/dL   Nitrite NEGATIVE  NEGATIVE   Leukocytes, UA  SMALL (*) NEGATIVE  URINE MICROSCOPIC-ADD ON     Status: Abnormal   Collection Time   09/28/12  5:23 PM      Component Value Range   Squamous Epithelial / LPF FEW (*) RARE   WBC, UA 3-6  <3 WBC/hpf   RBC / HPF 0-2  <3 RBC/hpf   Bacteria, UA FEW (*) RARE  CBC     Status: Abnormal   Collection Time   09/28/12  7:13 PM      Component Value Range   WBC 10.7 (*) 4.0 - 10.5 K/uL   RBC 3.48 (*) 3.87 - 5.11 MIL/uL   Hemoglobin 9.8 (*) 12.0 - 15.0 g/dL   HCT 78.2 (*) 95.6 - 21.3 %   MCV 84.8  78.0 - 100.0 fL   MCH 28.2  26.0 - 34.0 pg   MCHC 33.2  30.0 - 36.0 g/dL   RDW 08.6  57.8 - 46.9 %   Platelets 203  150 - 400 K/uL  COMPREHENSIVE METABOLIC PANEL     Status: Abnormal   Collection Time   09/28/12  7:13 PM      Component Value Range   Sodium 134 (*) 135 - 145 mEq/L   Potassium 3.8  3.5 - 5.1 mEq/L   Chloride 101  96 - 112 mEq/L   CO2 24  19 - 32 mEq/L   Glucose, Bld 91  70 - 99 mg/dL   BUN 14  6 - 23 mg/dL   Creatinine, Ser 6.29  0.50 - 1.10 mg/dL   Calcium 9.3  8.4 - 52.8 mg/dL   Total Protein 5.9 (*) 6.0 - 8.3 g/dL   Albumin 2.5 (*) 3.5 - 5.2 g/dL   AST 9  0 - 37 U/L   ALT 6  0 - 35 U/L   Alkaline Phosphatase 151 (*) 39 - 117 U/L   Total Bilirubin 0.8  0.3 - 1.2 mg/dL   GFR  calc non Af Amer >90  >90 mL/min   GFR calc Af Amer >90  >90 mL/min    Imaging:  Dg Chest 2 View  09/28/2012  *RADIOLOGY REPORT*  Clinical Data: Shortness of breath.  [redacted] weeks pregnant.  CHEST - 2 VIEW  Comparison: Acute abdominal series 08/29/2009.  Findings: The abdomen was shielded. The heart size and mediastinal contours are normal. The lungs are clear. There is no pleural effusion or pneumothorax. No acute osseous findings are identified.  IMPRESSION: No active cardiopulmonary process.   Original Report Authenticated By: Gerrianne Scale, M.D.    ED Course   Assessment: 1. PUPP (pruritic urticarial papules and plaques of pregnancy)   2. SOB (shortness of breath), likely pregnancy related due to  enlarged uterus and increased cardiac output. No evidence of serious cardiopulmonary processes.  3. Edema in pregnancy, antepartum    Plan: Discharge home Labor  And PIH precautions and fetal kick counts Return for worsening SOB.      Follow-up Information    Follow up with Roseanna Rainbow, MD. On 09/30/2012. (or MAU as needed if symptoms worsen)    Contact information:   [address] [place] [address] [phone]           Medication List     As of 09/28/2012 10:04 PM    TAKE these medications         acetaminophen 325 MG tablet   Commonly known as: TYLENOL   Take 325 mg by mouth every 6 (six) hours as needed. For headache or pain      clotrimazole 1 % cream   Commonly known as: LOTRIMIN   Apply 1 application topically 3 (three) times daily.      fluconazole 150 MG tablet   Commonly known as: DIFLUCAN   Take one tablet by mouth on Sunday, 2 days after your dose in MAU.      hydrOXYzine 25 MG capsule   Commonly known as: VISTARIL   Take 1-2 capsules (25-50 mg total) by mouth 3 (three) times daily as needed for anxiety.      ondansetron 8 MG tablet   Commonly known as: ZOFRAN   Take 1 tablet (8 mg total) by mouth every 8 (eight) hours as needed for nausea.      prenatal multivitamin Tabs   Take 1 tablet by mouth at bedtime.         Britton, CNM 09/28/2012 10:04 PM

## 2012-09-28 NOTE — MAU Note (Signed)
Pt reports reports her hand are swollen and she is itchy  And feels SOB . Symptoms started yesterday. Pt stated her pumps rash has come back on her abd as well and it hurts.

## 2012-09-28 NOTE — MAU Note (Signed)
"  I have PUPPS and I have been itching all over since yesterday.  It's really hard to get a deep breath.  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

## 2012-10-04 ENCOUNTER — Encounter (HOSPITAL_COMMUNITY): Payer: Self-pay | Admitting: *Deleted

## 2012-10-04 ENCOUNTER — Inpatient Hospital Stay (HOSPITAL_COMMUNITY)
Admission: AD | Admit: 2012-10-04 | Discharge: 2012-10-04 | Disposition: A | Discharge status: Home / Self Care | Payer: Medicaid Other | Source: Ambulatory Visit | Attending: Obstetrics & Gynecology | Admitting: Obstetrics & Gynecology

## 2012-10-04 DIAGNOSIS — O479 False labor, unspecified: Secondary | ICD-10-CM | POA: Insufficient documentation

## 2012-10-04 LAB — COMPREHENSIVE METABOLIC PANEL
AST: 13 U/L (ref 0–37)
Albumin: 2.3 g/dL — ABNORMAL LOW (ref 3.5–5.2)
Alkaline Phosphatase: 139 U/L — ABNORMAL HIGH (ref 39–117)
BUN: 8 mg/dL (ref 6–23)
Potassium: 3.5 mEq/L (ref 3.5–5.1)
Total Protein: 5.4 g/dL — ABNORMAL LOW (ref 6.0–8.3)

## 2012-10-04 NOTE — MAU Note (Signed)
Pt presents for contractions that started last night and have increased in intensity throughout the day.  Denies any LOF or bleeding.  Reports good fetal movement.

## 2012-10-06 ENCOUNTER — Other Ambulatory Visit: Payer: Self-pay | Admitting: Obstetrics

## 2012-10-07 ENCOUNTER — Inpatient Hospital Stay (HOSPITAL_COMMUNITY)
Admission: RE | Admit: 2012-10-07 | Discharge: 2012-10-13 | DRG: 765 | Disposition: A | Discharge status: Home / Self Care | Payer: Medicaid Other | Source: Ambulatory Visit | Attending: Obstetrics | Admitting: Obstetrics

## 2012-10-07 ENCOUNTER — Encounter (HOSPITAL_COMMUNITY): Payer: Self-pay

## 2012-10-07 DIAGNOSIS — T8189XA Other complications of procedures, not elsewhere classified, initial encounter: Secondary | ICD-10-CM | POA: Diagnosis not present

## 2012-10-07 DIAGNOSIS — D62 Acute posthemorrhagic anemia: Secondary | ICD-10-CM | POA: Diagnosis not present

## 2012-10-07 DIAGNOSIS — L299 Pruritus, unspecified: Secondary | ICD-10-CM | POA: Diagnosis present

## 2012-10-07 DIAGNOSIS — R000 Tachycardia, unspecified: Secondary | ICD-10-CM | POA: Diagnosis not present

## 2012-10-07 DIAGNOSIS — O99893 Other specified diseases and conditions complicating puerperium: Secondary | ICD-10-CM | POA: Diagnosis not present

## 2012-10-07 DIAGNOSIS — O26899 Other specified pregnancy related conditions, unspecified trimester: Secondary | ICD-10-CM | POA: Diagnosis present

## 2012-10-07 DIAGNOSIS — O9903 Anemia complicating the puerperium: Secondary | ICD-10-CM | POA: Diagnosis not present

## 2012-10-07 LAB — CBC
MCH: 27.1 pg (ref 26.0–34.0)
MCHC: 33.1 g/dL (ref 30.0–36.0)
MCV: 81.9 fL (ref 78.0–100.0)
Platelets: 230 10*3/uL (ref 150–400)
RBC: 3.65 MIL/uL — ABNORMAL LOW (ref 3.87–5.11)
RDW: 12.4 % (ref 11.5–15.5)

## 2012-10-07 MED ORDER — MISOPROSTOL 25 MCG QUARTER TABLET
25.0000 ug | ORAL_TABLET | Freq: Once | ORAL | Status: DC
  Filled 2012-10-07: qty 0.25

## 2012-10-07 MED ORDER — LIDOCAINE HCL (PF) 1 % IJ SOLN
30.0000 mL | INTRAMUSCULAR | Status: DC | PRN
  Filled 2012-10-07: qty 30

## 2012-10-07 MED ORDER — LACTATED RINGERS IV SOLN
INTRAVENOUS | Status: DC
  Administered 2012-10-07 – 2012-10-09 (×6): via INTRAVENOUS

## 2012-10-07 MED ORDER — ACETAMINOPHEN 325 MG PO TABS
650.0000 mg | ORAL_TABLET | ORAL | Status: DC | PRN
  Administered 2012-10-09: 650 mg via ORAL
  Filled 2012-10-07: qty 2

## 2012-10-07 MED ORDER — NALBUPHINE SYRINGE 5 MG/0.5 ML
10.0000 mg | INJECTION | Freq: Four times a day (QID) | INTRAMUSCULAR | Status: DC | PRN
  Administered 2012-10-07 – 2012-10-09 (×2): 10 mg via INTRAMUSCULAR
  Filled 2012-10-07 (×2): qty 1
  Filled 2012-10-07 (×2): qty 0.5

## 2012-10-07 MED ORDER — PROMETHAZINE HCL 25 MG/ML IJ SOLN
25.0000 mg | Freq: Four times a day (QID) | INTRAMUSCULAR | Status: DC | PRN
  Administered 2012-10-09: 25 mg via INTRAMUSCULAR
  Filled 2012-10-07: qty 1

## 2012-10-07 MED ORDER — OXYCODONE-ACETAMINOPHEN 5-325 MG PO TABS: 1.0000 | ORAL_TABLET | ORAL | Status: DC | PRN

## 2012-10-07 MED ORDER — NALBUPHINE HCL 10 MG/ML IJ SOLN: 10.0000 mg | Freq: Four times a day (QID) | INTRAMUSCULAR | Status: DC | PRN

## 2012-10-07 MED ORDER — LACTATED RINGERS IV SOLN
500.0000 mL | INTRAVENOUS | Status: DC | PRN
  Administered 2012-10-09: 500 mL via INTRAVENOUS

## 2012-10-07 MED ORDER — TERBUTALINE SULFATE 1 MG/ML IJ SOLN: 0.2500 mg | Freq: Once | INTRAMUSCULAR | Status: AC | PRN

## 2012-10-07 MED ORDER — OXYTOCIN 40 UNITS IN LACTATED RINGERS INFUSION - SIMPLE MED
1.0000 m[IU]/min | INTRAVENOUS | Status: DC
  Administered 2012-10-08 – 2012-10-09 (×2): 1 m[IU]/min via INTRAVENOUS
  Administered 2012-10-09: 6 m[IU]/min via INTRAVENOUS
  Filled 2012-10-07: qty 1000

## 2012-10-07 MED ORDER — OXYTOCIN 40 UNITS IN LACTATED RINGERS INFUSION - SIMPLE MED
62.5000 mL/h | Freq: Once | INTRAVENOUS | Status: DC
  Filled 2012-10-07: qty 1000

## 2012-10-07 MED ORDER — MISOPROSTOL 25 MCG QUARTER TABLET
25.0000 ug | ORAL_TABLET | ORAL | Status: DC | PRN
  Administered 2012-10-07 (×2): 25 ug via VAGINAL
  Filled 2012-10-07 (×4): qty 0.25

## 2012-10-07 MED ORDER — OXYTOCIN BOLUS FROM INFUSION
500.0000 mL | Freq: Once | INTRAVENOUS | Status: DC
  Filled 2012-10-07: qty 500

## 2012-10-07 MED ORDER — NALBUPHINE SYRINGE 5 MG/0.5 ML
10.0000 mg | INJECTION | INTRAMUSCULAR | Status: DC | PRN
  Administered 2012-10-07 – 2012-10-09 (×6): 10 mg via INTRAVENOUS
  Filled 2012-10-07: qty 0.5
  Filled 2012-10-07: qty 1
  Filled 2012-10-07 (×3): qty 0.5
  Filled 2012-10-07 (×3): qty 1

## 2012-10-07 MED ORDER — CITRIC ACID-SODIUM CITRATE 334-500 MG/5ML PO SOLN
30.0000 mL | ORAL | Status: DC | PRN
  Administered 2012-10-10: 30 mL via ORAL
  Filled 2012-10-07: qty 15

## 2012-10-07 MED ORDER — IBUPROFEN 600 MG PO TABS: 600.0000 mg | ORAL_TABLET | Freq: Four times a day (QID) | ORAL | Status: DC | PRN

## 2012-10-07 MED ORDER — ONDANSETRON HCL 4 MG/2ML IJ SOLN
4.0000 mg | Freq: Four times a day (QID) | INTRAMUSCULAR | Status: DC | PRN
  Administered 2012-10-08 – 2012-10-10 (×3): 4 mg via INTRAVENOUS
  Filled 2012-10-07 (×3): qty 2

## 2012-10-07 NOTE — H&P (Signed)
Gloria Valdez is a 21 y.o. female presenting for pruritis. Maternal Medical History:  Reason for admission: 21 yo G3 P0  EDC 10-09-12.  H/O severe pruritis.  Presents for IOL.  Fetal activity: Perceived fetal activity is normal.   Last perceived fetal movement was within the past hour.    Prenatal complications: Pruritis.  Prenatal Complications - Diabetes: none.    OB History    Grav Para Term Preterm Abortions TAB SAB Ect Mult Living   3 0 0 0 2 0 2 0 0 0      Past Medical History  Diagnosis Date  . Asthma    Past Surgical History  Procedure Date  . Tonsillectomy   . First rib removal    Family History: family history includes Asthma in her father; Diabetes in her mother; and Hypertension in her father.  There is no history of Other. Social History:  reports that she has never smoked. She does not have any smokeless tobacco history on file. She reports that she does not drink alcohol or use illicit drugs.   Prenatal Transfer Tool  Maternal Diabetes: No Genetic Screening: Normal Maternal Ultrasounds/Referrals: Normal Fetal Ultrasounds or other Referrals:  None Maternal Substance Abuse:  No Significant Maternal Medications:  Meds include: Other:  Significant Maternal Lab Results:  Lab values include: Group B Strep negative Other Comments:  None  Review of Systems  Skin: Positive for itching.  All other systems reviewed and are negative.      There were no vitals taken for this visit. Maternal Exam:  Abdomen: Patient reports no abdominal tenderness. Fetal presentation: vertex  Introitus: Normal vulva. Normal vagina.  Pelvis: adequate for delivery.   Cervix: Cervix evaluated by digital exam.     Physical Exam  Nursing note and vitals reviewed. Constitutional: She is oriented to person, place, and time. She appears well-developed and well-nourished.  HENT:  Head: Normocephalic and atraumatic.  Eyes: Conjunctivae normal are normal. Pupils are equal,  round, and reactive to light.  Neck: Normal range of motion. Neck supple.  Cardiovascular: Normal rate and regular rhythm.   Respiratory: Effort normal.  GI: Soft.  Genitourinary: Vagina normal and uterus normal.  Musculoskeletal: Normal range of motion.  Neurological: She is alert and oriented to person, place, and time.  Skin: Skin is warm and dry.  Psychiatric: She has a normal mood and affect. Her behavior is normal. Judgment and thought content normal.    Prenatal labs: ABO, Rh: --/--/A NEG, A NEG (07/25 1000) Antibody: NEG (07/25 1000) Rubella:   RPR:    HBsAg:    HIV:    GBS:     Assessment/Plan: 39 weeks.  Severe pruritis.  Admit.  2 stage IOL.   Gloria Valdez A 10/07/2012, 7:21 AM

## 2012-10-07 NOTE — Progress Notes (Signed)
Pt up to take a shower per Dr. Thomes Lolling orders.  Linens changed.

## 2012-10-08 MED ORDER — MISOPROSTOL 25 MCG QUARTER TABLET
25.0000 ug | ORAL_TABLET | ORAL | Status: DC
  Administered 2012-10-08 – 2012-10-09 (×3): 25 ug via VAGINAL
  Filled 2012-10-08: qty 1

## 2012-10-08 NOTE — Progress Notes (Signed)
Spoke with provider about pt progress--orders to d/c pitocin, allow pt to eat dinner, remove monitors, and shower, start Cytotec and fetal monitoring at 2300

## 2012-10-08 NOTE — Progress Notes (Signed)
Orders to hold cytotec due to uc's being too close together

## 2012-10-09 ENCOUNTER — Encounter (HOSPITAL_COMMUNITY): Payer: Self-pay | Admitting: Anesthesiology

## 2012-10-09 ENCOUNTER — Encounter (HOSPITAL_COMMUNITY): Payer: Self-pay

## 2012-10-09 LAB — CBC
MCH: 26.8 pg (ref 26.0–34.0)
MCHC: 32.2 g/dL (ref 30.0–36.0)
MCV: 83.2 fL (ref 78.0–100.0)
Platelets: 228 10*3/uL (ref 150–400)
RBC: 3.81 MIL/uL — ABNORMAL LOW (ref 3.87–5.11)

## 2012-10-09 MED ORDER — PHENYLEPHRINE 40 MCG/ML (10ML) SYRINGE FOR IV PUSH (FOR BLOOD PRESSURE SUPPORT)
80.0000 ug | PREFILLED_SYRINGE | INTRAVENOUS | Status: DC | PRN
  Filled 2012-10-09: qty 5

## 2012-10-09 MED ORDER — CLINDAMYCIN PHOSPHATE 900 MG/50ML IV SOLN
900.0000 mg | Freq: Three times a day (TID) | INTRAVENOUS | Status: DC
  Administered 2012-10-10: 900 mg via INTRAVENOUS
  Filled 2012-10-09 (×3): qty 50

## 2012-10-09 MED ORDER — FENTANYL 2.5 MCG/ML BUPIVACAINE 1/10 % EPIDURAL INFUSION (WH - ANES)
14.0000 mL/h | INTRAMUSCULAR | Status: DC
  Administered 2012-10-09 – 2012-10-10 (×2): 14 mL/h via EPIDURAL
  Filled 2012-10-09 (×3): qty 125

## 2012-10-09 MED ORDER — FENTANYL 2.5 MCG/ML BUPIVACAINE 1/10 % EPIDURAL INFUSION (WH - ANES)
INTRAMUSCULAR | Status: DC | PRN
  Administered 2012-10-09: 15 mL/h via EPIDURAL

## 2012-10-09 MED ORDER — LACTATED RINGERS IV SOLN: 500.0000 mL | Freq: Once | INTRAVENOUS | Status: DC

## 2012-10-09 MED ORDER — EPHEDRINE 5 MG/ML INJ
10.0000 mg | INTRAVENOUS | Status: DC | PRN
  Filled 2012-10-09: qty 4

## 2012-10-09 MED ORDER — LIDOCAINE HCL (PF) 1 % IJ SOLN
INTRAMUSCULAR | Status: DC | PRN
  Administered 2012-10-09 (×2): 5 mL

## 2012-10-09 MED ORDER — PHENYLEPHRINE 40 MCG/ML (10ML) SYRINGE FOR IV PUSH (FOR BLOOD PRESSURE SUPPORT): 80.0000 ug | PREFILLED_SYRINGE | INTRAVENOUS | Status: DC | PRN

## 2012-10-09 MED ORDER — DIPHENHYDRAMINE HCL 50 MG/ML IJ SOLN
12.5000 mg | INTRAMUSCULAR | Status: DC | PRN
  Administered 2012-10-09: 12.5 mg via INTRAVENOUS
  Filled 2012-10-09: qty 1

## 2012-10-09 MED ORDER — EPHEDRINE 5 MG/ML INJ: 10.0000 mg | INTRAVENOUS | Status: DC | PRN

## 2012-10-09 NOTE — Anesthesia Procedure Notes (Signed)
Epidural Patient location during procedure: OB Start time: 10/09/2012 5:19 PM  Staffing Anesthesiologist: Sidharth Leverette A. Performed by: anesthesiologist   Preanesthetic Checklist Completed: patient identified, site marked, surgical consent, pre-op evaluation, timeout performed, IV checked, risks and benefits discussed and monitors and equipment checked  Epidural Patient position: sitting Prep: site prepped and draped and DuraPrep Patient monitoring: continuous pulse ox and blood pressure Approach: midline Injection technique: LOR air  Needle:  Needle type: Tuohy  Needle gauge: 17 G Needle length: 9 cm and 9 Needle insertion depth: 5 cm cm Catheter type: closed end flexible Catheter size: 19 Gauge Catheter at skin depth: 10 cm Test dose: negative and Other  Assessment Events: blood not aspirated, injection not painful, no injection resistance, negative IV test and no paresthesia  Additional Notes Patient identified. Risks and benefits discussed including failed block, incomplete  Pain control, post dural puncture headache, nerve damage, paralysis, blood pressure Changes, nausea, vomiting, reactions to medications-both toxic and allergic and post Partum back pain. All questions were answered. Patient expressed understanding and wished to proceed. Sterile technique was used throughout procedure. Epidural site was Dressed with sterile barrier dressing. No paresthesias, signs of intravascular injection Or signs of intrathecal spread were encountered.  Patient was more comfortable after the epidural was dosed. Please see RN's note for documentation of vital signs and FHR which are stable.

## 2012-10-09 NOTE — Anesthesia Preprocedure Evaluation (Signed)

## 2012-10-10 ENCOUNTER — Encounter (HOSPITAL_COMMUNITY)
Admission: RE | Disposition: A | Discharge status: Home / Self Care | Payer: Self-pay | Source: Ambulatory Visit | Attending: Obstetrics

## 2012-10-10 ENCOUNTER — Inpatient Hospital Stay (HOSPITAL_COMMUNITY): Payer: Medicaid Other | Admitting: Anesthesiology

## 2012-10-10 ENCOUNTER — Encounter (HOSPITAL_COMMUNITY): Payer: Self-pay | Admitting: Anesthesiology

## 2012-10-10 ENCOUNTER — Encounter (HOSPITAL_COMMUNITY): Payer: Self-pay

## 2012-10-10 LAB — CBC
Hemoglobin: 7.7 g/dL — ABNORMAL LOW (ref 12.0–15.0)
MCH: 27.6 pg (ref 26.0–34.0)
MCHC: 33.5 g/dL (ref 30.0–36.0)
Platelets: 184 10*3/uL (ref 150–400)
RBC: 2.79 MIL/uL — ABNORMAL LOW (ref 3.87–5.11)

## 2012-10-10 LAB — BASIC METABOLIC PANEL
Calcium: 8.3 mg/dL — ABNORMAL LOW (ref 8.4–10.5)
GFR calc Af Amer: 55 mL/min — ABNORMAL LOW (ref >90)
GFR calc non Af Amer: 47 mL/min — ABNORMAL LOW (ref >90)
Glucose, Bld: 83 mg/dL (ref 70–99)
Potassium: 3.7 mEq/L (ref 3.5–5.1)
Sodium: 135 mEq/L (ref 135–145)

## 2012-10-10 SURGERY — Surgical Case
Anesthesia: Epidural | Site: Abdomen | Wound class: Clean Contaminated

## 2012-10-10 MED ORDER — PRENATAL MULTIVITAMIN CH
1.0000 | ORAL_TABLET | Freq: Every day | ORAL | Status: DC
  Administered 2012-10-11 – 2012-10-13 (×3): 1 via ORAL
  Filled 2012-10-10 (×3): qty 1

## 2012-10-10 MED ORDER — OXYCODONE-ACETAMINOPHEN 5-325 MG PO TABS
1.0000 | ORAL_TABLET | ORAL | Status: DC | PRN
  Administered 2012-10-10 – 2012-10-11 (×3): 2 via ORAL
  Administered 2012-10-11: 1 via ORAL
  Administered 2012-10-11: 2 via ORAL
  Administered 2012-10-11 – 2012-10-12 (×4): 1 via ORAL
  Administered 2012-10-12: 2 via ORAL
  Administered 2012-10-12 – 2012-10-13 (×6): 1 via ORAL
  Filled 2012-10-10: qty 2
  Filled 2012-10-10: qty 1
  Filled 2012-10-10: qty 2
  Filled 2012-10-10 (×3): qty 1
  Filled 2012-10-10 (×2): qty 2
  Filled 2012-10-10: qty 1
  Filled 2012-10-10: qty 2
  Filled 2012-10-10 (×6): qty 1

## 2012-10-10 MED ORDER — 0.9 % SODIUM CHLORIDE (POUR BTL) OPTIME
TOPICAL | Status: DC | PRN
  Administered 2012-10-10: 1000 mL

## 2012-10-10 MED ORDER — NALBUPHINE HCL 10 MG/ML IJ SOLN
5.0000 mg | INTRAMUSCULAR | Status: DC | PRN
  Administered 2012-10-10: 10 mg via SUBCUTANEOUS
  Filled 2012-10-10 (×2): qty 1

## 2012-10-10 MED ORDER — MORPHINE SULFATE (PF) 0.5 MG/ML IJ SOLN
INTRAMUSCULAR | Status: DC | PRN
  Administered 2012-10-10: 1 mg via EPIDURAL

## 2012-10-10 MED ORDER — ONDANSETRON HCL 4 MG/2ML IJ SOLN: 4.0000 mg | Freq: Three times a day (TID) | INTRAMUSCULAR | Status: DC | PRN

## 2012-10-10 MED ORDER — DIPHENHYDRAMINE HCL 50 MG/ML IJ SOLN: 25.0000 mg | INTRAMUSCULAR | Status: DC | PRN

## 2012-10-10 MED ORDER — SODIUM CHLORIDE 0.9 % IJ SOLN: 3.0000 mL | INTRAMUSCULAR | Status: DC | PRN

## 2012-10-10 MED ORDER — SODIUM CHLORIDE 0.9 % IV SOLN
1.0000 ug/kg/h | INTRAVENOUS | Status: DC | PRN
  Filled 2012-10-10: qty 2.5

## 2012-10-10 MED ORDER — MEPERIDINE HCL 25 MG/ML IJ SOLN
INTRAMUSCULAR | Status: DC | PRN
  Administered 2012-10-10: 12.5 mg via INTRAVENOUS

## 2012-10-10 MED ORDER — METOCLOPRAMIDE HCL 5 MG/ML IJ SOLN: 10.0000 mg | Freq: Three times a day (TID) | INTRAMUSCULAR | Status: DC | PRN

## 2012-10-10 MED ORDER — SODIUM CHLORIDE 0.9 % IJ SOLN
3.0000 mL | Freq: Two times a day (BID) | INTRAMUSCULAR | Status: DC
  Administered 2012-10-11: 3 mL via INTRAVENOUS

## 2012-10-10 MED ORDER — IBUPROFEN 600 MG PO TABS: 600.0000 mg | ORAL_TABLET | Freq: Four times a day (QID) | ORAL | Status: DC | PRN

## 2012-10-10 MED ORDER — TETANUS-DIPHTH-ACELL PERTUSSIS 5-2.5-18.5 LF-MCG/0.5 IM SUSP: 0.5000 mL | Freq: Once | INTRAMUSCULAR | Status: DC

## 2012-10-10 MED ORDER — MIDAZOLAM HCL 2 MG/2ML IJ SOLN
INTRAMUSCULAR | Status: AC
  Filled 2012-10-10: qty 2

## 2012-10-10 MED ORDER — LACTATED RINGERS IV SOLN
INTRAVENOUS | Status: DC | PRN
  Administered 2012-10-10: 06:00:00 via INTRAVENOUS

## 2012-10-10 MED ORDER — MAGNESIUM HYDROXIDE 400 MG/5ML PO SUSP: 30.0000 mL | ORAL | Status: DC | PRN

## 2012-10-10 MED ORDER — NALBUPHINE HCL 10 MG/ML IJ SOLN
5.0000 mg | INTRAMUSCULAR | Status: DC | PRN
  Filled 2012-10-10: qty 1

## 2012-10-10 MED ORDER — SODIUM BICARBONATE 8.4 % IV SOLN
INTRAVENOUS | Status: DC | PRN
  Administered 2012-10-10: 5 mL via EPIDURAL

## 2012-10-10 MED ORDER — OXYTOCIN 40 UNITS IN LACTATED RINGERS INFUSION - SIMPLE MED: 62.5000 mL/h | INTRAVENOUS | Status: AC

## 2012-10-10 MED ORDER — IBUPROFEN 600 MG PO TABS: 600.0000 mg | ORAL_TABLET | Freq: Four times a day (QID) | ORAL | Status: DC

## 2012-10-10 MED ORDER — NALOXONE HCL 0.4 MG/ML IJ SOLN: 0.4000 mg | INTRAMUSCULAR | Status: DC | PRN

## 2012-10-10 MED ORDER — MEPERIDINE HCL 25 MG/ML IJ SOLN
INTRAMUSCULAR | Status: AC
  Filled 2012-10-10: qty 1

## 2012-10-10 MED ORDER — MIDAZOLAM HCL 5 MG/5ML IJ SOLN
INTRAMUSCULAR | Status: DC | PRN
  Administered 2012-10-10: 2 mg via INTRAVENOUS

## 2012-10-10 MED ORDER — KETOROLAC TROMETHAMINE 30 MG/ML IJ SOLN
30.0000 mg | Freq: Four times a day (QID) | INTRAMUSCULAR | Status: DC | PRN
  Administered 2012-10-10: 30 mg via INTRAVENOUS
  Filled 2012-10-10: qty 1

## 2012-10-10 MED ORDER — INFLUENZA VIRUS VACC SPLIT PF IM SUSP: 0.5000 mL | INTRAMUSCULAR | Status: DC

## 2012-10-10 MED ORDER — KETOROLAC TROMETHAMINE 30 MG/ML IJ SOLN: 30.0000 mg | Freq: Four times a day (QID) | INTRAMUSCULAR | Status: AC | PRN

## 2012-10-10 MED ORDER — LACTATED RINGERS IV SOLN
INTRAVENOUS | Status: DC
  Administered 2012-10-11: 03:00:00 via INTRAVENOUS

## 2012-10-10 MED ORDER — NALOXONE HCL 0.4 MG/ML IJ SOLN: 1.0000 ug/kg/h | INTRAMUSCULAR | Status: DC | PRN

## 2012-10-10 MED ORDER — FENTANYL CITRATE 0.05 MG/ML IJ SOLN
INTRAMUSCULAR | Status: DC | PRN
  Administered 2012-10-10: 100 ug via EPIDURAL

## 2012-10-10 MED ORDER — ONDANSETRON HCL 4 MG/2ML IJ SOLN
INTRAMUSCULAR | Status: DC | PRN
  Administered 2012-10-10: 4 mg via INTRAVENOUS

## 2012-10-10 MED ORDER — SCOPOLAMINE 1 MG/3DAYS TD PT72
MEDICATED_PATCH | TRANSDERMAL | Status: AC
  Administered 2012-10-10: 1.5 mg via TRANSDERMAL
  Filled 2012-10-10: qty 1

## 2012-10-10 MED ORDER — FENTANYL CITRATE 0.05 MG/ML IJ SOLN: 25.0000 ug | INTRAMUSCULAR | Status: DC | PRN

## 2012-10-10 MED ORDER — MORPHINE SULFATE 0.5 MG/ML IJ SOLN
INTRAMUSCULAR | Status: AC
  Filled 2012-10-10: qty 10

## 2012-10-10 MED ORDER — FERROUS SULFATE 325 (65 FE) MG PO TABS
325.0000 mg | ORAL_TABLET | Freq: Two times a day (BID) | ORAL | Status: DC
  Administered 2012-10-10 – 2012-10-13 (×5): 325 mg via ORAL
  Filled 2012-10-10 (×5): qty 1

## 2012-10-10 MED ORDER — SENNOSIDES-DOCUSATE SODIUM 8.6-50 MG PO TABS
2.0000 | ORAL_TABLET | Freq: Every day | ORAL | Status: DC
  Administered 2012-10-10 – 2012-10-13 (×3): 2 via ORAL

## 2012-10-10 MED ORDER — METHYLERGONOVINE MALEATE 0.2 MG/ML IJ SOLN
INTRAMUSCULAR | Status: AC
  Filled 2012-10-10: qty 1

## 2012-10-10 MED ORDER — METHYLERGONOVINE MALEATE 0.2 MG/ML IJ SOLN
INTRAMUSCULAR | Status: DC | PRN
  Administered 2012-10-10: 0.2 mg via INTRAMUSCULAR

## 2012-10-10 MED ORDER — KETOROLAC TROMETHAMINE 60 MG/2ML IM SOLN
INTRAMUSCULAR | Status: AC
  Administered 2012-10-10: 60 mg via INTRAMUSCULAR
  Filled 2012-10-10: qty 2

## 2012-10-10 MED ORDER — OXYTOCIN 10 UNIT/ML IJ SOLN
40.0000 [IU] | INTRAVENOUS | Status: DC | PRN
  Administered 2012-10-10: 40 [IU] via INTRAVENOUS

## 2012-10-10 MED ORDER — DIPHENHYDRAMINE HCL 50 MG/ML IJ SOLN: 12.5000 mg | INTRAMUSCULAR | Status: DC | PRN

## 2012-10-10 MED ORDER — NALBUPHINE HCL 10 MG/ML IJ SOLN
5.0000 mg | INTRAMUSCULAR | Status: DC | PRN
  Administered 2012-10-10 (×2): 5 mg via INTRAVENOUS
  Filled 2012-10-10 (×2): qty 1

## 2012-10-10 MED ORDER — KETOROLAC TROMETHAMINE 30 MG/ML IJ SOLN: 30.0000 mg | Freq: Four times a day (QID) | INTRAMUSCULAR | Status: DC | PRN

## 2012-10-10 MED ORDER — SIMETHICONE 80 MG PO CHEW
80.0000 mg | CHEWABLE_TABLET | ORAL | Status: DC | PRN
  Administered 2012-10-13: 80 mg via ORAL

## 2012-10-10 MED ORDER — LANOLIN HYDROUS EX OINT: 1.0000 "application " | TOPICAL_OINTMENT | CUTANEOUS | Status: DC | PRN

## 2012-10-10 MED ORDER — DIPHENHYDRAMINE HCL 25 MG PO CAPS
25.0000 mg | ORAL_CAPSULE | Freq: Four times a day (QID) | ORAL | Status: DC | PRN
  Filled 2012-10-10: qty 1

## 2012-10-10 MED ORDER — CHLOROPROCAINE HCL 3 % IJ SOLN
INTRAMUSCULAR | Status: AC
  Filled 2012-10-10: qty 60

## 2012-10-10 MED ORDER — LACTATED RINGERS IV BOLUS (SEPSIS)
1000.0000 mL | Freq: Once | INTRAVENOUS | Status: AC
  Administered 2012-10-10: 1000 mL via INTRAVENOUS

## 2012-10-10 MED ORDER — PHENYLEPHRINE HCL 10 MG/ML IJ SOLN
INTRAMUSCULAR | Status: DC | PRN
  Administered 2012-10-10 (×2): 80 ug via INTRAVENOUS

## 2012-10-10 MED ORDER — DIBUCAINE 1 % RE OINT: 1.0000 "application " | TOPICAL_OINTMENT | RECTAL | Status: DC | PRN

## 2012-10-10 MED ORDER — ONDANSETRON HCL 4 MG/2ML IJ SOLN
4.0000 mg | INTRAMUSCULAR | Status: DC | PRN
Start: 2012-10-10 — End: 2012-10-13

## 2012-10-10 MED ORDER — ONDANSETRON HCL 4 MG PO TABS: 4.0000 mg | ORAL_TABLET | ORAL | Status: DC | PRN

## 2012-10-10 MED ORDER — SCOPOLAMINE 1 MG/3DAYS TD PT72
1.0000 | MEDICATED_PATCH | Freq: Once | TRANSDERMAL | Status: AC
  Administered 2012-10-10: 1.5 mg via TRANSDERMAL

## 2012-10-10 MED ORDER — MORPHINE SULFATE (PF) 0.5 MG/ML IJ SOLN
INTRAMUSCULAR | Status: DC | PRN
  Administered 2012-10-10: 4 mg via EPIDURAL

## 2012-10-10 MED ORDER — KETOROLAC TROMETHAMINE 60 MG/2ML IM SOLN
60.0000 mg | Freq: Once | INTRAMUSCULAR | Status: AC | PRN
  Administered 2012-10-10: 60 mg via INTRAMUSCULAR

## 2012-10-10 MED ORDER — NALBUPHINE HCL 10 MG/ML IJ SOLN
5.0000 mg | INTRAMUSCULAR | Status: DC | PRN
  Administered 2012-10-11: 10 mg via SUBCUTANEOUS

## 2012-10-10 MED ORDER — DIPHENHYDRAMINE HCL 25 MG PO CAPS
25.0000 mg | ORAL_CAPSULE | ORAL | Status: DC | PRN
  Administered 2012-10-10: 25 mg via ORAL
  Filled 2012-10-10: qty 1

## 2012-10-10 MED ORDER — MEASLES, MUMPS & RUBELLA VAC ~~LOC~~ INJ: 0.5000 mL | INJECTION | Freq: Once | SUBCUTANEOUS | Status: DC

## 2012-10-10 MED ORDER — ZOLPIDEM TARTRATE 5 MG PO TABS: 5.0000 mg | ORAL_TABLET | Freq: Every evening | ORAL | Status: DC | PRN

## 2012-10-10 MED ORDER — CEFAZOLIN SODIUM-DEXTROSE 2-3 GM-% IV SOLR
2.0000 g | Freq: Once | INTRAVENOUS | Status: AC
  Administered 2012-10-10: 2 g via INTRAVENOUS
  Filled 2012-10-10: qty 50

## 2012-10-10 MED ORDER — DIPHENHYDRAMINE HCL 25 MG PO CAPS
25.0000 mg | ORAL_CAPSULE | ORAL | Status: DC | PRN
  Administered 2012-10-11 – 2012-10-12 (×2): 25 mg via ORAL
  Filled 2012-10-10: qty 1

## 2012-10-10 MED ORDER — WITCH HAZEL-GLYCERIN EX PADS: 1.0000 "application " | MEDICATED_PAD | CUTANEOUS | Status: DC | PRN

## 2012-10-10 MED ORDER — FENTANYL CITRATE 0.05 MG/ML IJ SOLN
INTRAMUSCULAR | Status: AC
  Filled 2012-10-10: qty 2

## 2012-10-10 SURGICAL SUPPLY — 33 items
ADH SKN CLS APL DERMABOND .7 (GAUZE/BANDAGES/DRESSINGS) ×1
CLOTH BEACON ORANGE TIMEOUT ST (SAFETY) ×2 IMPLANT
DERMABOND ADVANCED (GAUZE/BANDAGES/DRESSINGS) ×1
DERMABOND ADVANCED .7 DNX12 (GAUZE/BANDAGES/DRESSINGS) ×1 IMPLANT
DRAPE SURG 17X23 STRL (DRAPES) ×2 IMPLANT
DRSG COVADERM 4X10 (GAUZE/BANDAGES/DRESSINGS) IMPLANT
DURAPREP 26ML APPLICATOR (WOUND CARE) ×2 IMPLANT
ELECT REM PT RETURN 9FT ADLT (ELECTROSURGICAL) ×2
ELECTRODE REM PT RTRN 9FT ADLT (ELECTROSURGICAL) ×1 IMPLANT
EXTRACTOR VACUUM M CUP 4 TUBE (SUCTIONS) IMPLANT
GLOVE BIO SURGEON STRL SZ8.5 (GLOVE) ×4 IMPLANT
GOWN PREVENTION PLUS LG XLONG (DISPOSABLE) ×4 IMPLANT
GOWN PREVENTION PLUS XXLARGE (GOWN DISPOSABLE) ×2 IMPLANT
KIT ABG SYR 3ML LUER SLIP (SYRINGE) IMPLANT
NDL HYPO 25X5/8 SAFETYGLIDE (NEEDLE) ×1 IMPLANT
NEEDLE HYPO 25X5/8 SAFETYGLIDE (NEEDLE) IMPLANT
NS IRRIG 1000ML POUR BTL (IV SOLUTION) ×2 IMPLANT
PACK C SECTION WH (CUSTOM PROCEDURE TRAY) ×2 IMPLANT
PAD OB MATERNITY 4.3X12.25 (PERSONAL CARE ITEMS) ×1 IMPLANT
SLEEVE SCD COMPRESS KNEE MED (MISCELLANEOUS) ×1 IMPLANT
SPONGE LAP 18X18 X RAY DECT (DISPOSABLE) ×1 IMPLANT
SUT CHROMIC 0 CT 802H (SUTURE) ×2 IMPLANT
SUT CHROMIC 1 CTX 36 (SUTURE) ×4 IMPLANT
SUT CHROMIC 2 0 SH (SUTURE) ×2 IMPLANT
SUT GUT PLAIN 0 CT-3 TAN 27 (SUTURE) IMPLANT
SUT MON AB 4-0 PS1 27 (SUTURE) ×2 IMPLANT
SUT VIC AB 0 CT1 18XCR BRD8 (SUTURE) IMPLANT
SUT VIC AB 0 CT1 8-18 (SUTURE)
SUT VIC AB 0 CTX 36 (SUTURE) ×4
SUT VIC AB 0 CTX36XBRD ANBCTRL (SUTURE) ×2 IMPLANT
TOWEL OR 17X24 6PK STRL BLUE (TOWEL DISPOSABLE) ×4 IMPLANT
TRAY FOLEY CATH 14FR (SET/KITS/TRAYS/PACK) ×1 IMPLANT
WATER STERILE IRR 1000ML POUR (IV SOLUTION) ×1 IMPLANT

## 2012-10-10 NOTE — Anesthesia Postprocedure Evaluation (Signed)
  Anesthesia Post-op Note  Patient: Gloria Valdez  Procedure(s) Performed: Procedure(s) (LRB) with comments: CESAREAN SECTION (N/A) - Primary Cesarean Section Delivery Boy @ [phone], Apgars 6/9  Patient Location: PACU  Anesthesia Type: Spinal  Level of Consciousness: awake, alert  and oriented  Airway and Oxygen Therapy: Patient Spontanous Breathing  Post-op Pain: none  Post-op Assessment: Post-op Vital signs reviewed, Patient's Cardiovascular Status Stable, Respiratory Function Stable, Patent Airway, No signs of Nausea or vomiting, Pain level controlled, No headache, No backache, No residual numbness and No residual motor weakness  Post-op Vital Signs: Reviewed and stable  Complications: No apparent anesthesia complications

## 2012-10-10 NOTE — Progress Notes (Signed)
Gloria Valdez is a 21 y.o. G3P0020 at [redacted]w[redacted]d by LMP admitted for induction of labor due to severe PUPPS..  Subjective:   Objective: BP 124/83  Pulse 87  Temp 99.5 F (37.5 C) (Axillary)  Resp 18  Ht 5\' 9"  (1.753 m)  Wt 83.008 kg (183 lb)  BMI 27.02 kg/m2  SpO2 99%  Breastfeeding? Unknown      FHT:  FHR: 150 bpm, variability: moderate,  accelerations:  Present,  decelerations:  Absent UC:   regular, every 3 minutes SVE:   Dilation: Lip/rim Effacement (%): 100 Station: 0 Exam by:: Gloria Alm, RN  Labs: Lab Results  Component Value Date   WBC 9.6 10/09/2012   HGB 10.2* 10/09/2012   HCT 31.7* 10/09/2012   MCV 83.2 10/09/2012   PLT 228 10/09/2012    Assessment / Plan: Induction of labor due to PUPPS,  progressing well on pitocin  Labor: Progressing normally Preeclampsia:  n/a Fetal Wellbeing:  Category I Pain Control:  Epidural I/D:  n/a Anticipated MOD:  NSVD  Gloria Valdez A 10/10/2012, 1:09 AM

## 2012-10-10 NOTE — Anesthesia Postprocedure Evaluation (Signed)
  Anesthesia Post-op Note  Patient: Gloria Valdez  Procedure(s) Performed: Procedure(s) (LRB) with comments: CESAREAN SECTION (N/A) - Primary Cesarean Section Delivery Boy @ [phone], Apgars 6/9  Patient Location: PACU and Mother/Baby  Anesthesia Type: Epidural  Level of Consciousness: awake, alert  and oriented  Airway and Oxygen Therapy: Patient Spontanous Breathing  Post-op Pain: mild  Post-op Assessment: Post-op Vital signs reviewed  Post-op Vital Signs: Reviewed and stable  Complications: No apparent anesthesia complications

## 2012-10-10 NOTE — Progress Notes (Signed)
Pt. In high-fowlers position, pulse went to 157.  Pt. Asymptomatic.  After several minutes pulse came down to 98.  Proceeded to have pt. Stand and take a few steps.  Pulse went to 200.  Pt. Back into bed and asymptomatic.  Pulse back down to 98.  Called Dr. Tamela Oddi.  Verbal orders given.  Will continue to monitor

## 2012-10-10 NOTE — Addendum Note (Signed)
Addendum  created 10/10/12 1726 by Armanda Heritage, RN   Modules edited:Charges VN, Notes Section

## 2012-10-10 NOTE — Progress Notes (Signed)
Dr. Gaynell Face at bedside to evaluate infant and maternal status.  Patient advised about plan of care to include laboring down at this time and re-evaluating at a later time.  Patient had opportunity to ask questions and has no questions at this time.

## 2012-10-10 NOTE — Progress Notes (Signed)
UR Chart review completed.  

## 2012-10-10 NOTE — Transfer of Care (Signed)
**Note Gloria-Identified via Obfuscation** Immediate Anesthesia Transfer of Care Note  Patient: Gloria Valdez  Procedure(s) Performed: Procedure(s) (LRB) with comments: CESAREAN SECTION (N/A) - Primary Cesarean Section Delivery Boy @ [phone], Apgars 6/9  Patient Location: PACU  Anesthesia Type: Epidural  Level of Consciousness: awake and alert   Airway & Oxygen Therapy: Patient Spontanous Breathing and Patient connected to nasal cannula oxygen  Post-op Assessment: Report given to PACU RN  Post vital signs: Reviewed  Complications: No apparent anesthesia complications

## 2012-10-10 NOTE — Progress Notes (Signed)
Patient ID: Gloria Valdez, female   DOB: 1991-01-09, 21 y.o.   MRN: [id number] Patient became dilated and pushed  45 minutes did not bring the baby down   0 station she's had a lot of molding and she was allowed to labor down and and pushed again but she has not brought the baby down to  0 station so she'll     be delivered by C-section

## 2012-10-10 NOTE — Progress Notes (Signed)
Dr. Gaynell Face called and updated on maternal temp with current nursing interventions, SVE, and ROM time.  New order received for clindamycin 900mg  IV every 8 hours.  Patient updated on new plan of care and verbalizes understanding.a

## 2012-10-10 NOTE — Progress Notes (Signed)
Dr. Gaynell Face called and notified of pushing efforts for one hour.  MD to come and evaluate caput and station.

## 2012-10-10 NOTE — Progress Notes (Signed)
Dr. Gaynell Face called to check on patient.  Advised of SVE, station, infant caput, amternal vitals and that pushing has been going on for 10-15 minutes.  Will notify doctor when needed.

## 2012-10-10 NOTE — Consult Note (Signed)
Neonatology Note:  Attendance at C-section:  I was asked to attend this primary C/S at term due to failure to progress after induction for severe pruritis. The mother is a G3P0A2 A neg, GBS neg with induction of labor. ROM 14 hours prior to delivery, fluid clear. Infant with decreased tone and a little slow to cry, but responded to stimulation. Needed only minimal bulb suctioning. Ap 6/9. Lungs clear to ausc in DR when crying, but he tended to begin to have subcostal retractions at rest. No history of maternal narcotics/magnesium sulfate. The baby is very LGA and has a very boggy scalp and a right preauricular skin tag on exam, shown to mother and support person in DR. Due to minimal resp distress, I instructed OB nurse to allow a brief time for baby with his mother, but to observe closely and take to CN shortly. To CN to care of Pediatrician.  Nilay Mangrum, MD  

## 2012-10-10 NOTE — Progress Notes (Signed)
Notified Dr. Tamela Oddi regarding bloodwork results from 10/10/2012 @ 2030. Per Dr. Tamela Oddi, pt. to rest, lab to repeat bloodwork in AM, d/c ibuprofen, & monitor pt.'s output. Informed pt. of plans, pt. verbalized understanding. Will continue to monitor.

## 2012-10-11 DIAGNOSIS — T8189XA Other complications of procedures, not elsewhere classified, initial encounter: Secondary | ICD-10-CM | POA: Diagnosis not present

## 2012-10-11 LAB — CBC
Hemoglobin: 7.3 g/dL — ABNORMAL LOW (ref 12.0–15.0)
MCH: 27.4 pg (ref 26.0–34.0)
MCHC: 33.5 g/dL (ref 30.0–36.0)
RDW: 12.8 % (ref 11.5–15.5)

## 2012-10-11 LAB — BASIC METABOLIC PANEL
CO2: 28 mEq/L (ref 19–32)
Chloride: 103 mEq/L (ref 96–112)
Glucose, Bld: 73 mg/dL (ref 70–99)
Sodium: 137 mEq/L (ref 135–145)

## 2012-10-11 MED ORDER — RHO D IMMUNE GLOBULIN 1500 UNIT/2ML IJ SOLN
300.0000 ug | Freq: Once | INTRAMUSCULAR | Status: AC
  Administered 2012-10-11: 300 ug via INTRAMUSCULAR
  Filled 2012-10-11: qty 2

## 2012-10-11 NOTE — Progress Notes (Signed)
Dr. Tamela Oddi here to see patient. Patient becomes tachy cardiac with sitting and standing, no complaints of light headedness, tolerates well, complains of soreness at the incision, sitting on side of bed. Patient wants to get in wheel chair and visit baby in NICU accompanied by father of baby.

## 2012-10-11 NOTE — Progress Notes (Signed)
Patient ID: Gloria Valdez, female   DOB: 06-05-1991, 21 y.o.   MRN: [id number] Subjective: POD# 1 s/p Cesarean Delivery.  Indications: failure to progress  RH status/Rubella reviewed. Feeding: breast Patient reports tolerating PO.  Denies HA/SOB/C/P/N/V/dizziness.  Breast symptoms: no.  She reports vaginal bleeding as normal, without clots.       Objective: Vital signs in last 24 hours: BP 108/75  Pulse 140  Temp 98.8 F (37.1 C) (Oral)  Resp 24  Ht 5\' 9"  (1.753 m)  Wt 83.008 kg (183 lb)  BMI 27.02 kg/m2  SpO2 94%  Breastfeeding? Unknown   Total I/O In: 480 [P.O.:480] Out: 1200 [Urine:1200]   Physical Exam:  General: alert CV: Regular rate and rhythm Resp: clear Abdomen: soft, nontender, normal bowel sounds Lochia: minimal Uterine Fundus: firm, below umbilicus, nontender Incision: clean, dry and intact Ext: extremities normal, atraumatic, no cyanosis or edema    Basename 10/11/12 0515 10/10/12 2030  HGB 7.3* 7.7*  HCT 21.8* 23.0*      Assessment/Plan: 21 y.o.  status post Cesarean section. POD# 1.   Anemia secondary to blood loss.  Less symptomatic.  Check orthostatic vital signs             Advance diet as tolerated Start po pain meds D/C foley  HLIV  Ambulate as tolerated IS Routine post-op care  JACKSON-MOORE,Jamella Grayer A 10/11/2012, 10:35 AM

## 2012-10-11 NOTE — Progress Notes (Signed)
Patient to NICU to see baby; with a visitor in a wheel chair after foley catheter removed. Patient ambulated to bathroom with stand by assist, no c/o of dizzines, just soreness at incision. Felt like her heart rate was increased. Patient tolerates well.

## 2012-10-11 NOTE — Progress Notes (Signed)
Patient going to NICU via wheelchair to breastfeed infant.  Significant other to accompany.

## 2012-10-12 LAB — RH IG WORKUP (INCLUDES ABO/RH)
ABO/RH(D): A NEG
Gestational Age(Wks): 40

## 2012-10-12 LAB — BASIC METABOLIC PANEL
GFR calc Af Amer: 80 mL/min — ABNORMAL LOW (ref >90)
GFR calc non Af Amer: 69 mL/min — ABNORMAL LOW (ref >90)
Potassium: 3.6 mEq/L (ref 3.5–5.1)
Sodium: 135 mEq/L (ref 135–145)

## 2012-10-12 MED ORDER — INFLUENZA VIRUS VACC SPLIT PF IM SUSP
0.5000 mL | INTRAMUSCULAR | Status: AC
  Administered 2012-10-13: 0.5 mL via INTRAMUSCULAR
  Filled 2012-10-12: qty 0.5

## 2012-10-12 NOTE — Clinical Social Work Note (Signed)
Clinical Social Work Department PSYCHOSOCIAL ASSESSMENT - MATERNAL/CHILD 10/12/2012  Patient:  Valdez,Gloria D  Account Number:  [id number]  Admit Date:  10/07/2012  Childs Name:   Osiris (MOB is deciding between Watkins and Haaff for last name)    Clinical Social Worker:  Jennae Hakeem, LCSW   Date/Time:  10/12/2012 02:30 PM  Date Referred:  10/12/2012   Referral source  Physician  RN     Referred reason  NICU   Other referral source:    I:  FAMILY / HOME ENVIRONMENT Child's legal guardian:  PARENT  Guardian - Name Guardian - Age Guardian - Address  Gloria Valdez 21 [address] [place], [place] [address]  John Haaff     Other household support members/support persons Name Relationship DOB  Joshua Watkins UNCLE   Lisa Sowash GRAND MOTHER    Other support:   MOB reports good family support and she currently lives with her brother and mother.    II  PSYCHOSOCIAL DATA Information Source:  Patient Interview  Financial and Community Resources Employment:   MOB:  on leave from [employer]   Financial resources:  Medicaid If Medicaid - County:  [place] Other  Food Stamps  WIC   School / Grade:   Maternity Care Coordinator / Child Services Coordination / Early Interventions:  Cultural issues impacting care:    III  STRENGTHS Strengths  Adequate Resources  Supportive family/friends  Home prepared for Child (including basic supplies)  Compliance with medical plan  Understanding of illness   Strength comment:    IV  RISK FACTORS AND CURRENT PROBLEMS Current Problem:  None   Risk Factor & Current Problem Patient Issue Family Issue Risk Factor / Current Problem Comment   N N     V  SOCIAL WORK ASSESSMENT CSW spoke with MOB while she was in NICU.  CSW discussed SW role while infant is in NICU.  CSW discussed emotional stability with MOB.  MOB reports she is doing well now that its a possibility infant my discharge in 1-2 days.   CSW discussed  supplies and family support.  MOB reported she may ask about some resources for diapers, and had some questions about the birth certificate and FOB's name being on it, and whether she could get child support if the name was not there.  FOB has had limited involvement during pregnancy and MOB is not sure of his involvement in the future.  MOB reports no safety concerns.  MOB was woking before delivery and receives assistance through DSS with food stamps and WIC.  MOB reports good family support otherwise and she currently lives with her mother and brother.  CSW did not note any hx of concerns with SA.  CSW will continue to follow while infant is in NICU.      VI SOCIAL WORK PLAN Social Work Plan  Psychosocial Support/Ongoing Assessment of Needs   Type of pt/family education:   If child protective services report - county:   If child protective services report - date:   Information/referral to community resources comment:   Other social work plan:    

## 2012-10-12 NOTE — Progress Notes (Signed)
Patient ID: Gloria Valdez, female   DOB: 1991/06/19, 21 y.o.   MRN: [id number] Subjective: POD# 2 s/p Cesarean Delivery.  Indications: failure to progress  RH status/Rubella reviewed. Feeding: breast Patient reports tolerating PO.  Denies HA/SOB/C/P/N/V/dizziness.  Breast symptoms: no.  She reports vaginal bleeding as normal, without clots.       Objective: Vital signs in last 24 hours: BP 113/74  Pulse 81  Temp 97.8 F (36.6 C) (Oral)  Resp 18  Ht 5\' 9"  (1.753 m)  Wt 83.008 kg (183 lb)  BMI 27.02 kg/m2  SpO2 98%  Breastfeeding? Unknown       Physical Exam:  General: alert CV: Regular rate and rhythm Resp: clear Abdomen: soft, nontender, normal bowel sounds Lochia: minimal Uterine Fundus: firm, below umbilicus, nontender Incision: clean, dry and intact Ext: extremities normal, atraumatic, no cyanosis or edema   BMET    Component Value Date/Time   NA 135 10/12/2012 0530   K 3.6 10/12/2012 0530   CL 99 10/12/2012 0530   CO2 29 10/12/2012 0530   GLUCOSE 82 10/12/2012 0530   BUN 9 10/12/2012 0530   CREATININE 1.14* 10/12/2012 0530   CALCIUM 8.5 10/12/2012 0530   GFRNONAA 69* 10/12/2012 0530   GFRAA 80* 10/12/2012 0530      Assessment/Plan: 21 y.o.  status post Cesarean section. POD# 1.   Anemia secondary to blood loss. Stable. Creatinine trending down    Ambulate as tolerated IS Routine post-op care  JACKSON-MOORE,Hoy Fallert A 10/12/2012, 10:39 AM

## 2012-10-12 NOTE — Progress Notes (Signed)
Patient in hall coming back from NICU, fob pushing the wheelchair. Patient states she is feeling better today and is ambulating more. Baby is doing well and may be discharged tomorrow.

## 2012-10-13 ENCOUNTER — Encounter (HOSPITAL_COMMUNITY): Payer: Self-pay | Admitting: Obstetrics

## 2012-10-13 MED ORDER — VALACYCLOVIR HCL 1 G PO TABS: ORAL_TABLET | ORAL | Status: DC

## 2012-10-13 MED ORDER — OXYCODONE-ACETAMINOPHEN 5-325 MG PO TABS: 1.0000 | ORAL_TABLET | ORAL | Status: DC | PRN

## 2012-10-13 MED ORDER — FUSION PLUS PO CAPS: 1.0000 | ORAL_CAPSULE | Freq: Every day | ORAL | Status: DC

## 2012-10-13 NOTE — Discharge Summary (Signed)
Obstetric Discharge Summary Reason for Admission: induction of labor Prenatal Procedures: ultrasound Intrapartum Procedures: cesarean: low cervical, transverse Postpartum Procedures: none Complications-Operative and Postpartum: hemorrhage intraoperatively. Hemoglobin  Date Value Range Status  10/11/2012 7.3* 12.0 - 15.0 g/dL Final     HCT  Date Value Range Status  10/11/2012 21.8* 36.0 - 46.0 % Final    Physical Exam:  General: alert and no distress Lochia: appropriate Uterine Fundus: firm Incision: healing well DVT Evaluation: No evidence of DVT seen on physical exam.  Discharge Diagnoses: Term Pregnancy-delivered and anemia, clinically stable.  Discharge Information: Date: 10/13/2012 Activity: pelvic rest Diet: routine Medications: PNV, Colace, Iron and Percocet Condition: stable Instructions: refer to practice specific booklet Discharge to: home Follow-up Information    Follow up with Judianne Seiple A, MD. Schedule an appointment as soon as possible for a visit in 2 weeks.   Contact information:   [address] [place] [address] [phone]          Newborn Data: Live born female  Birth Weight: 9 lb 11.7 oz (4415 g) APGAR: 6, 9  Home with mother.  Ceairra Mccarver A 10/13/2012, 9:00 AM

## 2012-10-13 NOTE — Progress Notes (Signed)
Ur chart review completed.  

## 2012-10-13 NOTE — Progress Notes (Signed)
Subjective: Postpartum Day 3: Cesarean Delivery Patient reports tolerating PO.    Objective: Vital signs in last 24 hours: Temp:  [97.9 F (36.6 C)-98 F (36.7 C)] 98 F (36.7 C) (10/21 0501) Pulse Rate:  [62-118] 62  (10/21 0501) Resp:  [18-20] 18  (10/21 0501) BP: (115-138)/(78-88) 115/79 mmHg (10/21 0501) SpO2:  [100 %] 100 % (10/20 2225)  Physical Exam:  General: alert and no distress Lochia: appropriate Uterine Fundus: firm Incision: healing well DVT Evaluation: No evidence of DVT seen on physical exam.   Basename 10/11/12 0515 10/10/12 2030  HGB 7.3* 7.7*  HCT 21.8* 23.0*    Assessment/Plan: Status post Cesarean section. Doing well postoperatively.  Discharge home with standard precautions and return to clinic in 2 weeks.  HARPER,CHARLES A 10/13/2012, 8:51 AM

## 2013-01-30 NOTE — Op Note (Signed)
Date of operation 10/10/2012 Preop diagnosis failure to progress in labor Postop diagnosis the same Anesthesia epidural Surgeon Dr. Francoise Ceo Procedure patient placed on the operating table in the supine position abdomen prepped and draped bladder emptied with a Foley catheter a transverse suprapubic incision made carried down to the rectus fascia fascia cleaned and incised the length of the incision the rectus muscle retracted laterally peritoneum will longitudinally transverse incision made on the visceroperitoneum above the bladder bladder mobilized inferiorly transverse low uterine incision made patient delivered from the OP position of a female Apgar 6 and 9 the team was in attendance the placenta was removed manually and sent to labor and delivery uterine cavity clean with dry laps uterine incision closed in one layer with continuous  Suture of number  one chromic hemostasis satisfactory bladder flap reattached to a chromic uterus well contracted tubes and ovaries normal abdomen chosen as peritoneum continuous with 2-0 chromic fascia continuous with of 0 Dexon skin shows a subcuticular stitch of 4-0 Monocryl patient tolerated the procedure well taken to recovery room in good condition at

## 2013-06-30 ENCOUNTER — Encounter (HOSPITAL_COMMUNITY): Payer: Self-pay | Admitting: *Deleted

## 2013-06-30 ENCOUNTER — Emergency Department (HOSPITAL_COMMUNITY)
Admission: EM | Admit: 2013-06-30 | Discharge: 2013-07-01 | Disposition: A | Discharge status: Home / Self Care | Payer: Medicaid Other | Attending: Emergency Medicine | Admitting: Emergency Medicine

## 2013-06-30 ENCOUNTER — Emergency Department (HOSPITAL_COMMUNITY): Payer: Medicaid Other

## 2013-06-30 DIAGNOSIS — Z79899 Other long term (current) drug therapy: Secondary | ICD-10-CM | POA: Insufficient documentation

## 2013-06-30 DIAGNOSIS — R112 Nausea with vomiting, unspecified: Secondary | ICD-10-CM | POA: Insufficient documentation

## 2013-06-30 DIAGNOSIS — Z791 Long term (current) use of non-steroidal anti-inflammatories (NSAID): Secondary | ICD-10-CM | POA: Insufficient documentation

## 2013-06-30 DIAGNOSIS — R300 Dysuria: Secondary | ICD-10-CM | POA: Insufficient documentation

## 2013-06-30 DIAGNOSIS — R42 Dizziness and giddiness: Secondary | ICD-10-CM | POA: Insufficient documentation

## 2013-06-30 DIAGNOSIS — Z8742 Personal history of other diseases of the female genital tract: Secondary | ICD-10-CM | POA: Insufficient documentation

## 2013-06-30 DIAGNOSIS — N10 Acute tubulo-interstitial nephritis: Secondary | ICD-10-CM | POA: Insufficient documentation

## 2013-06-30 DIAGNOSIS — Z8719 Personal history of other diseases of the digestive system: Secondary | ICD-10-CM | POA: Insufficient documentation

## 2013-06-30 DIAGNOSIS — R197 Diarrhea, unspecified: Secondary | ICD-10-CM | POA: Insufficient documentation

## 2013-06-30 DIAGNOSIS — R630 Anorexia: Secondary | ICD-10-CM | POA: Insufficient documentation

## 2013-06-30 DIAGNOSIS — J45909 Unspecified asthma, uncomplicated: Secondary | ICD-10-CM | POA: Insufficient documentation

## 2013-06-30 HISTORY — DX: Irritable bowel syndrome, unspecified: K58.9

## 2013-06-30 HISTORY — DX: Endometriosis, unspecified: N80.9

## 2013-06-30 LAB — URINALYSIS, ROUTINE W REFLEX MICROSCOPIC
Bilirubin Urine: NEGATIVE
Glucose, UA: NEGATIVE mg/dL
Nitrite: POSITIVE — AB
Specific Gravity, Urine: 1.028 (ref 1.005–1.030)
pH: 7 (ref 5.0–8.0)

## 2013-06-30 LAB — LIPASE, BLOOD: Lipase: 18 U/L (ref 11–59)

## 2013-06-30 LAB — CBC WITH DIFFERENTIAL/PLATELET
Eosinophils Relative: 2 % (ref 0–5)
HCT: 39.1 % (ref 36.0–46.0)
Hemoglobin: 12.4 g/dL (ref 12.0–15.0)
Lymphocytes Relative: 32 % (ref 12–46)
Lymphs Abs: 2.3 10*3/uL (ref 0.7–4.0)
MCV: 80.1 fL (ref 78.0–100.0)
Monocytes Absolute: 0.5 10*3/uL (ref 0.1–1.0)
Monocytes Relative: 6 % (ref 3–12)
RBC: 4.88 MIL/uL (ref 3.87–5.11)
RDW: 13.9 % (ref 11.5–15.5)
WBC: 7.2 10*3/uL (ref 4.0–10.5)

## 2013-06-30 LAB — COMPREHENSIVE METABOLIC PANEL
ALT: 8 U/L (ref 0–35)
AST: 9 U/L (ref 0–37)
Albumin: 4.1 g/dL (ref 3.5–5.2)
Alkaline Phosphatase: 50 U/L (ref 39–117)
CO2: 28 mEq/L (ref 19–32)
Calcium: 10.1 mg/dL (ref 8.4–10.5)
Creatinine, Ser: 1.08 mg/dL (ref 0.50–1.10)
Glucose, Bld: 137 mg/dL — ABNORMAL HIGH (ref 70–99)

## 2013-06-30 LAB — WET PREP, GENITAL
Trich, Wet Prep: NONE SEEN
Yeast Wet Prep HPF POC: NONE SEEN

## 2013-06-30 LAB — URINE MICROSCOPIC-ADD ON

## 2013-06-30 MED ORDER — DEXTROSE 5 % IV SOLN
1.0000 g | Freq: Once | INTRAVENOUS | Status: AC
  Administered 2013-06-30: 1 g via INTRAVENOUS
  Filled 2013-06-30: qty 10

## 2013-06-30 MED ORDER — SODIUM CHLORIDE 0.9 % IV BOLUS (SEPSIS)
1000.0000 mL | Freq: Once | INTRAVENOUS | Status: AC
  Administered 2013-06-30: 1000 mL via INTRAVENOUS

## 2013-06-30 MED ORDER — MORPHINE SULFATE 4 MG/ML IJ SOLN
2.0000 mg | Freq: Once | INTRAMUSCULAR | Status: AC
  Administered 2013-06-30: 2 mg via INTRAVENOUS
  Filled 2013-06-30: qty 1

## 2013-06-30 MED ORDER — ONDANSETRON HCL 4 MG/2ML IJ SOLN
4.0000 mg | INTRAMUSCULAR | Status: AC
  Administered 2013-06-30: 4 mg via INTRAVENOUS
  Filled 2013-06-30: qty 2

## 2013-06-30 NOTE — ED Provider Notes (Signed)
History    CSN: [id number] Arrival date & time 06/30/13  1907  First MD Initiated Contact with Patient 06/30/13 1957     Chief Complaint  Patient presents with  . Abdominal Pain   (Consider location/radiation/quality/duration/timing/severity/associated sxs/prior Treatment) HPI Comments: Patient is a 22 year old female with a history of irritable bowel syndrome and endometriosis who presents for RLQ abdominal pain times one week. Patient states that symptoms have been constant since onset and gradually worsening. She describes the pain as a constant ache with intermittent sharp sensations, radiating around to her right flank. Patient admits to associated pressure with urination; she denies hematuria, urinary frequency, and urgency. Patient also admits to anorexia and nausea with 5 episodes of NB/NB emesis since her symptoms began. Patient had an episode of watery diarrhea yesterday which was nonbloody. She also had intermenstrual spotting last week; LMP 06/14/13. Patient went to [facility] yesterday for evaluation of symptoms. States all they did was blood work and sent her home. Patient returns today because she wants to know what is causing her pain. Only hx of abdominal surgery is a C section 8 months ago.  Patient is a 22 y.o. female presenting with abdominal pain. The history is provided by the patient. No language interpreter was used.  Abdominal Pain Associated symptoms include abdominal pain, nausea and vomiting. Pertinent negatives include no chest pain, fever, numbness or weakness.   Past Medical History  Diagnosis Date  . Asthma   . IBS (irritable bowel syndrome)   . Endometriosis    Past Surgical History  Procedure Laterality Date  . Tonsillectomy    . First rib removal    . Cesarean section  10/10/2012    Procedure: CESAREAN SECTION;  Surgeon: Kathreen Cosier, MD;  Location: [facility];  Service: Obstetrics;  Laterality: N/A;  Primary Cesarean Section Delivery Boy @  [phone], Apgars 6/9   Family History  Problem Relation Age of Onset  . Other Neg Hx   . Diabetes Mother   . Hypertension Father   . Asthma Father    History  Substance Use Topics  . Smoking status: Never Smoker   . Smokeless tobacco: Never Used  . Alcohol Use: No   OB History   Grav Para Term Preterm Abortions TAB SAB Ect Mult Living   3 1 1  0 2 0 2 0 0 1     Review of Systems  Constitutional: Negative for fever.  Respiratory: Negative for shortness of breath.   Cardiovascular: Negative for chest pain.  Gastrointestinal: Positive for nausea, vomiting, abdominal pain and diarrhea. Negative for blood in stool.  Genitourinary: Positive for dysuria ("pressure like"). Negative for urgency, frequency and hematuria.  Neurological: Positive for light-headedness. Negative for weakness and numbness.  All other systems reviewed and are negative.   Allergies  Bee venom; Cinnamon; Strawberry; Demerol; and Phenergan  Home Medications   Current Outpatient Rx  Name  Route  Sig  Dispense  Refill  . cephALEXin (KEFLEX) 500 MG capsule   Oral   Take 1 capsule (500 mg total) by mouth 4 (four) times daily.   40 capsule   0   . ibuprofen (ADVIL,MOTRIN) 800 MG tablet   Oral   Take 1 tablet (800 mg total) by mouth 3 (three) times daily.   21 tablet   0    BP 104/57  Pulse 52  Temp(Src) 97.8 F (36.6 C) (Oral)  Resp 19  Ht 5\' 8"  (1.727 m)  Wt 153 lb 6 oz (  69.57 kg)  BMI 23.33 kg/m2  SpO2 100%  LMP 06/14/2013 Physical Exam  Nursing note and vitals reviewed. Constitutional: She is oriented to person, place, and time. She appears well-developed and well-nourished. No distress.  HENT:  Head: Normocephalic and atraumatic.  Mouth/Throat: Oropharynx is clear and moist. No oropharyngeal exudate.  Eyes: Conjunctivae and EOM are normal. Pupils are equal, round, and reactive to light. No scleral icterus.  Neck: Normal range of motion.  Cardiovascular: Normal rate, regular rhythm, normal  heart sounds and intact distal pulses.   Pulmonary/Chest: Effort normal and breath sounds normal. No respiratory distress. She has no wheezes. She has no rales.  Abdominal: Soft. Normal appearance. She exhibits no ascites, no pulsatile midline mass and no mass. There is tenderness in the right lower quadrant. There is CVA tenderness (Right). There is no rigidity, no guarding, no tenderness at McBurney's point and negative Murphy's sign.    Genitourinary: Vagina normal and uterus normal. There is no rash, tenderness, lesion or injury on the right labia. There is no rash, tenderness, lesion or injury on the left labia. Cervix exhibits motion tenderness (mild). Cervix exhibits no discharge and no friability. Right adnexum displays tenderness (++). Right adnexum displays no mass. Left adnexum displays no mass and no tenderness.  Chaperoned GYN exam with +R adnexal tenderness  Musculoskeletal: Normal range of motion.  Neurological: She is alert and oriented to person, place, and time.  Skin: Skin is warm and dry. No rash noted. She is not diaphoretic. No erythema. No pallor.  Multiple tattoos  Psychiatric: She has a normal mood and affect. Her behavior is normal.    ED Course  Procedures (including critical care time) Labs Reviewed  WET PREP, GENITAL - Abnormal; Notable for the following:    Clue Cells Wet Prep HPF POC FEW (*)    WBC, Wet Prep HPF POC FEW (*)    All other components within normal limits  CBC WITH DIFFERENTIAL - Abnormal; Notable for the following:    MCH 25.4 (*)    All other components within normal limits  COMPREHENSIVE METABOLIC PANEL - Abnormal; Notable for the following:    Glucose, Bld 137 (*)    GFR calc non Af Amer 73 (*)    GFR calc Af Amer 85 (*)    All other components within normal limits  URINALYSIS, ROUTINE W REFLEX MICROSCOPIC - Abnormal; Notable for the following:    APPearance TURBID (*)    Hgb urine dipstick TRACE (*)    Nitrite POSITIVE (*)     Leukocytes, UA SMALL (*)    All other components within normal limits  URINE MICROSCOPIC-ADD ON - Abnormal; Notable for the following:    Bacteria, UA FEW (*)    All other components within normal limits  GC/CHLAMYDIA PROBE AMP  LIPASE, BLOOD  POCT PREGNANCY, URINE   US Transvaginal Non-ob  06/30/2013   *RADIOLOGY REPORT*  Clinical Data:  Right lower quadrant pelvic pain.  TRANSABDOMINAL AND TRANSVAGINAL ULTRASOUND OF PELVIS DOPPLER ULTRASOUND OF OVARIES  Technique:  Both transabdominal and transvaginal ultrasound examinations of the pelvis were performed. Transabdominal technique was performed for global imaging of the pelvis including uterus, ovaries, adnexal regions, and pelvic cul-de-sac.  It was necessary to proceed with endovaginal exam following the transabdominal exam to visualize the ovaries and endometrium.  Color and duplex Doppler ultrasound was utilized to evaluate blood flow to the ovaries.  Comparison:  CT abdomen and pelvis 10/22/2008  FINDINGS  Uterus:  The uterus is  anteverted and measures 7.6 x 4.1 x 5.1 cm. No focal myometrial mass lesions identified.  Small Nabothian cysts in the cervix.  Endometrium:  Endometrial stripe thickness is normal at 5.4 mm.  Right ovary: Right ovary measures 4.4 x 3 x 2.8 cm.  Normal follicular changes are demonstrated.  Flow is demonstrated in the right ovary on color flow Doppler imaging.  No abnormal adnexal masses.  Left ovary: The left ovary measures 3.6 x 2.7 x 3.3 cm.  Normal follicular changes are demonstrated.  There is a complex cyst with minimal internal septations and debris measuring about 1.9 cm maximal diameter.  This is likely a hemorrhagic cyst.  Flow is demonstrated in the left ovary on color flow Doppler imaging.  No abnormal adnexal masses.  Minimal free fluid in the pelvis.  Pulsed Doppler evaluation demonstrates normal low-resistance arterial and venous waveforms in both ovaries.  IMPRESSION:  Normal exam.  No evidence of pelvic mass or  other significant abnormality.  No sonographic evidence for ovarian torsion.   Original Report Authenticated By: Burman Nieves, M.D.   US Pelvis Complete  06/30/2013   *RADIOLOGY REPORT*  Clinical Data:  Right lower quadrant pelvic pain.  TRANSABDOMINAL AND TRANSVAGINAL ULTRASOUND OF PELVIS DOPPLER ULTRASOUND OF OVARIES  Technique:  Both transabdominal and transvaginal ultrasound examinations of the pelvis were performed. Transabdominal technique was performed for global imaging of the pelvis including uterus, ovaries, adnexal regions, and pelvic cul-de-sac.  It was necessary to proceed with endovaginal exam following the transabdominal exam to visualize the ovaries and endometrium.  Color and duplex Doppler ultrasound was utilized to evaluate blood flow to the ovaries.  Comparison:  CT abdomen and pelvis 10/22/2008  FINDINGS  Uterus:  The uterus is anteverted and measures 7.6 x 4.1 x 5.1 cm. No focal myometrial mass lesions identified.  Small Nabothian cysts in the cervix.  Endometrium:  Endometrial stripe thickness is normal at 5.4 mm.  Right ovary: Right ovary measures 4.4 x 3 x 2.8 cm.  Normal follicular changes are demonstrated.  Flow is demonstrated in the right ovary on color flow Doppler imaging.  No abnormal adnexal masses.  Left ovary: The left ovary measures 3.6 x 2.7 x 3.3 cm.  Normal follicular changes are demonstrated.  There is a complex cyst with minimal internal septations and debris measuring about 1.9 cm maximal diameter.  This is likely a hemorrhagic cyst.  Flow is demonstrated in the left ovary on color flow Doppler imaging.  No abnormal adnexal masses.  Minimal free fluid in the pelvis.  Pulsed Doppler evaluation demonstrates normal low-resistance arterial and venous waveforms in both ovaries.  IMPRESSION:  Normal exam.  No evidence of pelvic mass or other significant abnormality.  No sonographic evidence for ovarian torsion.   Original Report Authenticated By: Burman Nieves, M.D.   [place]  Art/ven Flow Abd Pelv Doppler  06/30/2013   *RADIOLOGY REPORT*  Clinical Data:  Right lower quadrant pelvic pain.  TRANSABDOMINAL AND TRANSVAGINAL ULTRASOUND OF PELVIS DOPPLER ULTRASOUND OF OVARIES  Technique:  Both transabdominal and transvaginal ultrasound examinations of the pelvis were performed. Transabdominal technique was performed for global imaging of the pelvis including uterus, ovaries, adnexal regions, and pelvic cul-de-sac.  It was necessary to proceed with endovaginal exam following the transabdominal exam to visualize the ovaries and endometrium.  Color and duplex Doppler ultrasound was utilized to evaluate blood flow to the ovaries.  Comparison:  CT abdomen and pelvis 10/22/2008  FINDINGS  Uterus:  The uterus is anteverted and measures  7.6 x 4.1 x 5.1 cm. No focal myometrial mass lesions identified.  Small Nabothian cysts in the cervix.  Endometrium:  Endometrial stripe thickness is normal at 5.4 mm.  Right ovary: Right ovary measures 4.4 x 3 x 2.8 cm.  Normal follicular changes are demonstrated.  Flow is demonstrated in the right ovary on color flow Doppler imaging.  No abnormal adnexal masses.  Left ovary: The left ovary measures 3.6 x 2.7 x 3.3 cm.  Normal follicular changes are demonstrated.  There is a complex cyst with minimal internal septations and debris measuring about 1.9 cm maximal diameter.  This is likely a hemorrhagic cyst.  Flow is demonstrated in the left ovary on color flow Doppler imaging.  No abnormal adnexal masses.  Minimal free fluid in the pelvis.  Pulsed Doppler evaluation demonstrates normal low-resistance arterial and venous waveforms in both ovaries.  IMPRESSION:  Normal exam.  No evidence of pelvic mass or other significant abnormality.  No sonographic evidence for ovarian torsion.   Original Report Authenticated By: Burman Nieves, M.D.    1. Acute pyelonephritis     MDM  RLQ abdominal pain times one week with nausea, intermittent NB/NB emesis, and dysuria.  Physical exam is above, significant for tenderness in the right suprapubic region, right CVA tenderness, and tenderness of the right adnexa. Patient well and hemodynamically stable in initial presentation without fever. Last without leukocytosis, anemia, hemoconcentration, or electrolyte imbalance. Liver and kidney function preserved. Urinalysis positive for bacteria, leukocytes, and nitrites. IV Rocephin ordered for likely pyelonephritis. Pelvic ultrasound ordered to rule out ovarian torsion in light of right adnexal tenderness; no significant findings appreciated on ultrasound. Given workup symptoms most consistent with uncomplicated acute pyelonephritis. Patient remained hemodynamically stable and afebrile throughout entire ED course and is appropriate for discharge with primary care followup. Prescription for Keflex given and ibuprofen advised for discomfort. Indications for ED return discussed with the patient who verbalizes comfort and understanding with this discharge plan. Patient work up and management discussed with Dr. Jeraldine Loots who is in agreement with assessment and patient's stability for d/c.  Antony Madura, PA-C 07/01/13 1904

## 2013-06-30 NOTE — ED Notes (Signed)
Pt c/o right lower abd pain, pressure with urination and defecation and n/v. Reports symptoms started 1 week ago.

## 2013-06-30 NOTE — Progress Notes (Signed)
Patient reports her pcp is Dr. Clearance Coots at [facility] center.

## 2013-07-01 LAB — GC/CHLAMYDIA PROBE AMP: CT Probe RNA: NEGATIVE

## 2013-07-01 MED ORDER — IBUPROFEN 800 MG PO TABS: 800.0000 mg | ORAL_TABLET | Freq: Three times a day (TID) | ORAL | Status: AC

## 2013-07-01 MED ORDER — MORPHINE SULFATE 4 MG/ML IJ SOLN
4.0000 mg | Freq: Once | INTRAMUSCULAR | Status: AC
  Administered 2013-07-01: 4 mg via INTRAVENOUS
  Filled 2013-07-01: qty 1

## 2013-07-01 MED ORDER — CEPHALEXIN 500 MG PO CAPS: 500.0000 mg | ORAL_CAPSULE | Freq: Four times a day (QID) | ORAL | Status: AC

## 2013-07-01 NOTE — ED Provider Notes (Signed)
  This was a shared visit with a mid-level provided (NP or PA).  Throughout the patient's course I was available for consultation/collaboration.  I saw the ECG (if appropriate), relevant labs and studies - I agree with the interpretation.  On my exam the patient was in no distress.      Gerhard Munch, MD 07/01/13 2325

## 2013-07-27 ENCOUNTER — Encounter: Payer: Self-pay | Admitting: Obstetrics

## 2013-07-27 ENCOUNTER — Ambulatory Visit (INDEPENDENT_AMBULATORY_CARE_PROVIDER_SITE_OTHER): Payer: Medicaid Other | Admitting: Obstetrics

## 2013-07-27 VITALS — BP 120/73 | HR 71 | Temp 98.1°F | Ht 69.0 in

## 2013-07-27 DIAGNOSIS — Z3009 Encounter for other general counseling and advice on contraception: Secondary | ICD-10-CM

## 2013-07-27 DIAGNOSIS — Z3200 Encounter for pregnancy test, result unknown: Secondary | ICD-10-CM | POA: Insufficient documentation

## 2013-07-27 LAB — POCT URINE PREGNANCY: Preg Test, Ur: NEGATIVE

## 2013-07-27 MED ORDER — NORGESTIMATE-ETH ESTRADIOL 0.25-35 MG-MCG PO TABS: 1.0000 | ORAL_TABLET | Freq: Every day | ORAL | Status: AC

## 2013-07-27 MED ORDER — VITAFOL ULTRA 29-0.6-0.4-200 MG PO CAPS: 1.0000 | ORAL_CAPSULE | Freq: Every day | ORAL | Status: AC

## 2013-07-27 NOTE — Progress Notes (Signed)
Subjective:     Gloria Valdez is a 22 y.o. female here for a routine exam.  Current complaints: possible pregnancy. Pt states she has been having some nausea, constipation and diarrhea. Pt states she is also having food cravings and swollen fingers. Pt is currently sexually active and is not using any birth control. Pregnancy test in office today is negative.  Personal health questionnaire reviewed: yes.   Gynecologic History Patient's last menstrual period was 06/16/2013. Contraception: none   Obstetric History OB History   Grav Para Term Preterm Abortions TAB SAB Ect Mult Living   3 1 1  0 2 0 2 0 0 1     # Outc Date GA Lbr Len/2nd Wgt Sex Del Anes PTL Lv   1 SAB 10/11           2 SAB 10/12           3 TRM 10/13 [redacted]w[redacted]d 00:00 / 03:54 9lb11.7oz(4.415kg) M LTCS EPI  Yes       The following portions of the patient's history were reviewed and updated as appropriate: allergies, current medications, past family history, past medical history, past social history, past surgical history and problem list.  Review of Systems Pertinent items are noted in HPI.    Objective:    No exam performed today, Consult for pregnancy diagnosis..    Assessment:    R/O pregnancy.  Urine pregnancy test negative.  Patient wants contraception if not pregnant.     Plan:      Sprintec 28 Rx.    PNV's Rx

## 2013-11-09 IMAGING — CR DG CHEST 2V
2 series · 2 of 2 positions shown · non-contrast
Comparison: Acute abdominal series 08/29/2009.

CLINICAL DATA: Shortness of breath.  38 weeks pregnant.

CHEST - 2 VIEW

[view not recorded (1 of 2)]
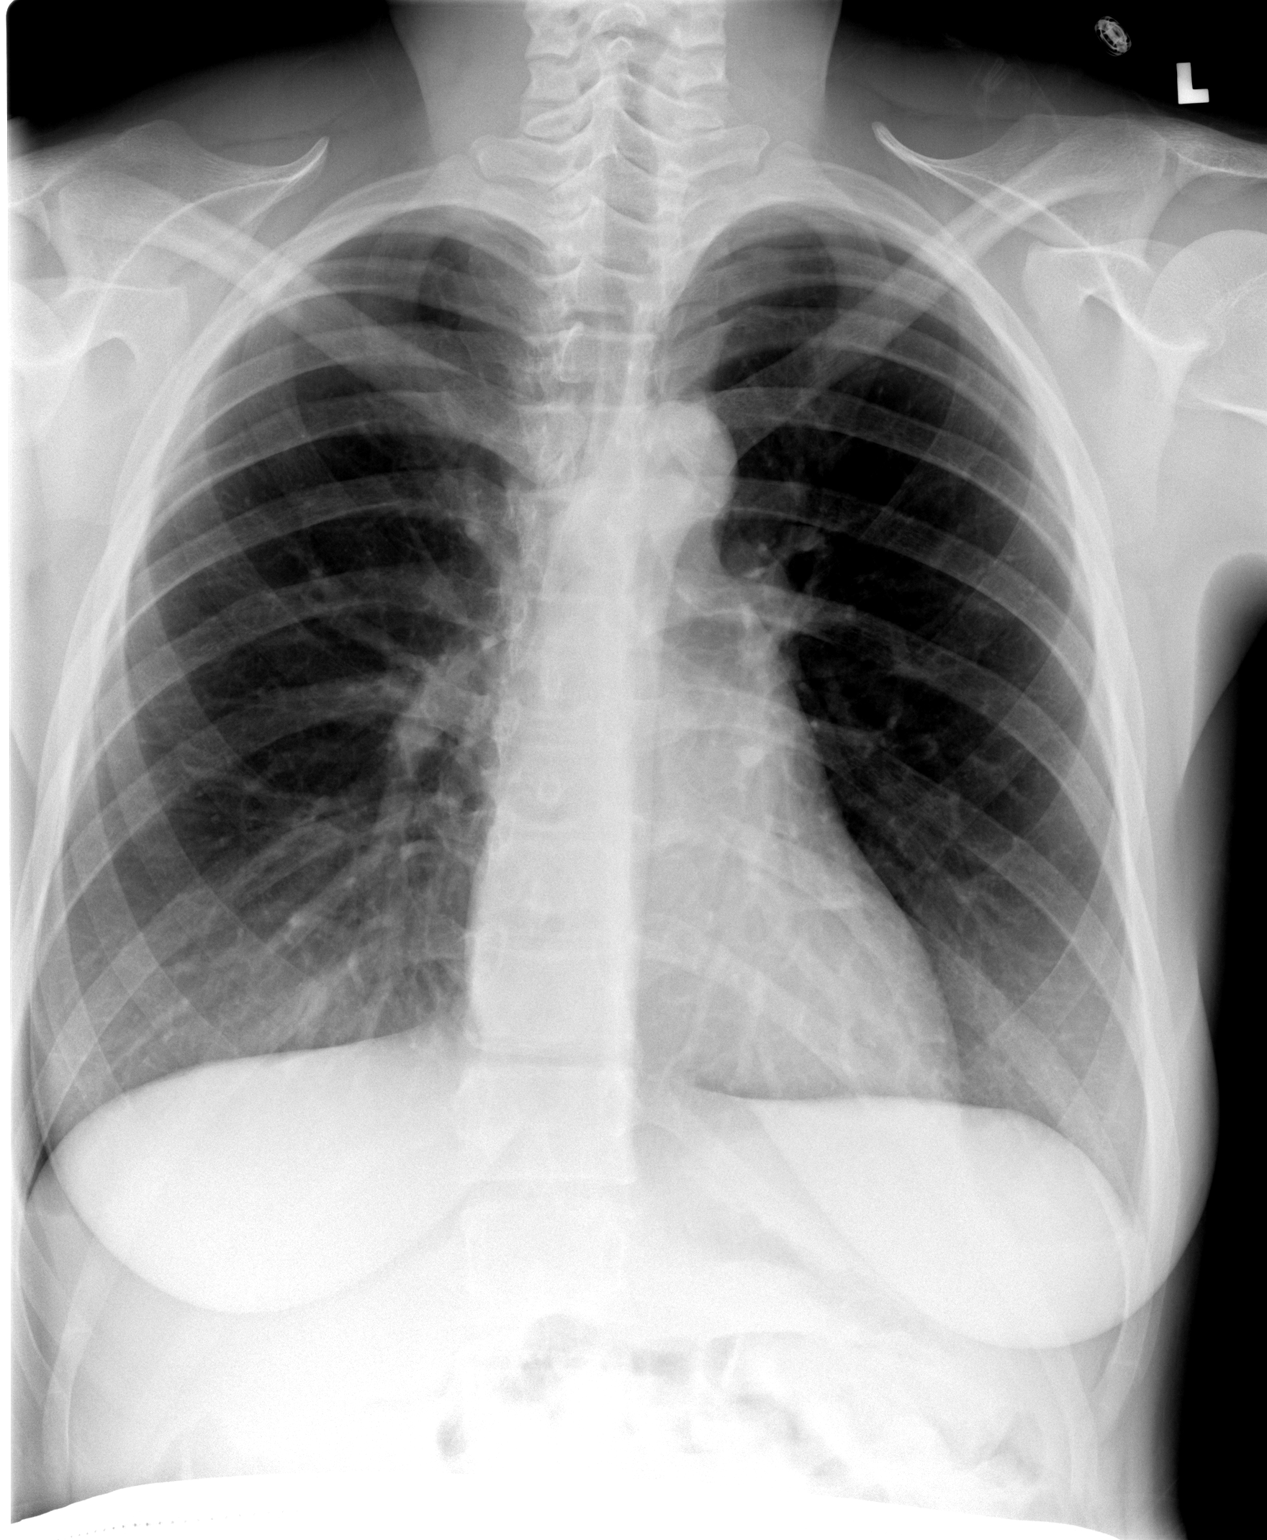

[view not recorded (2 of 2)]
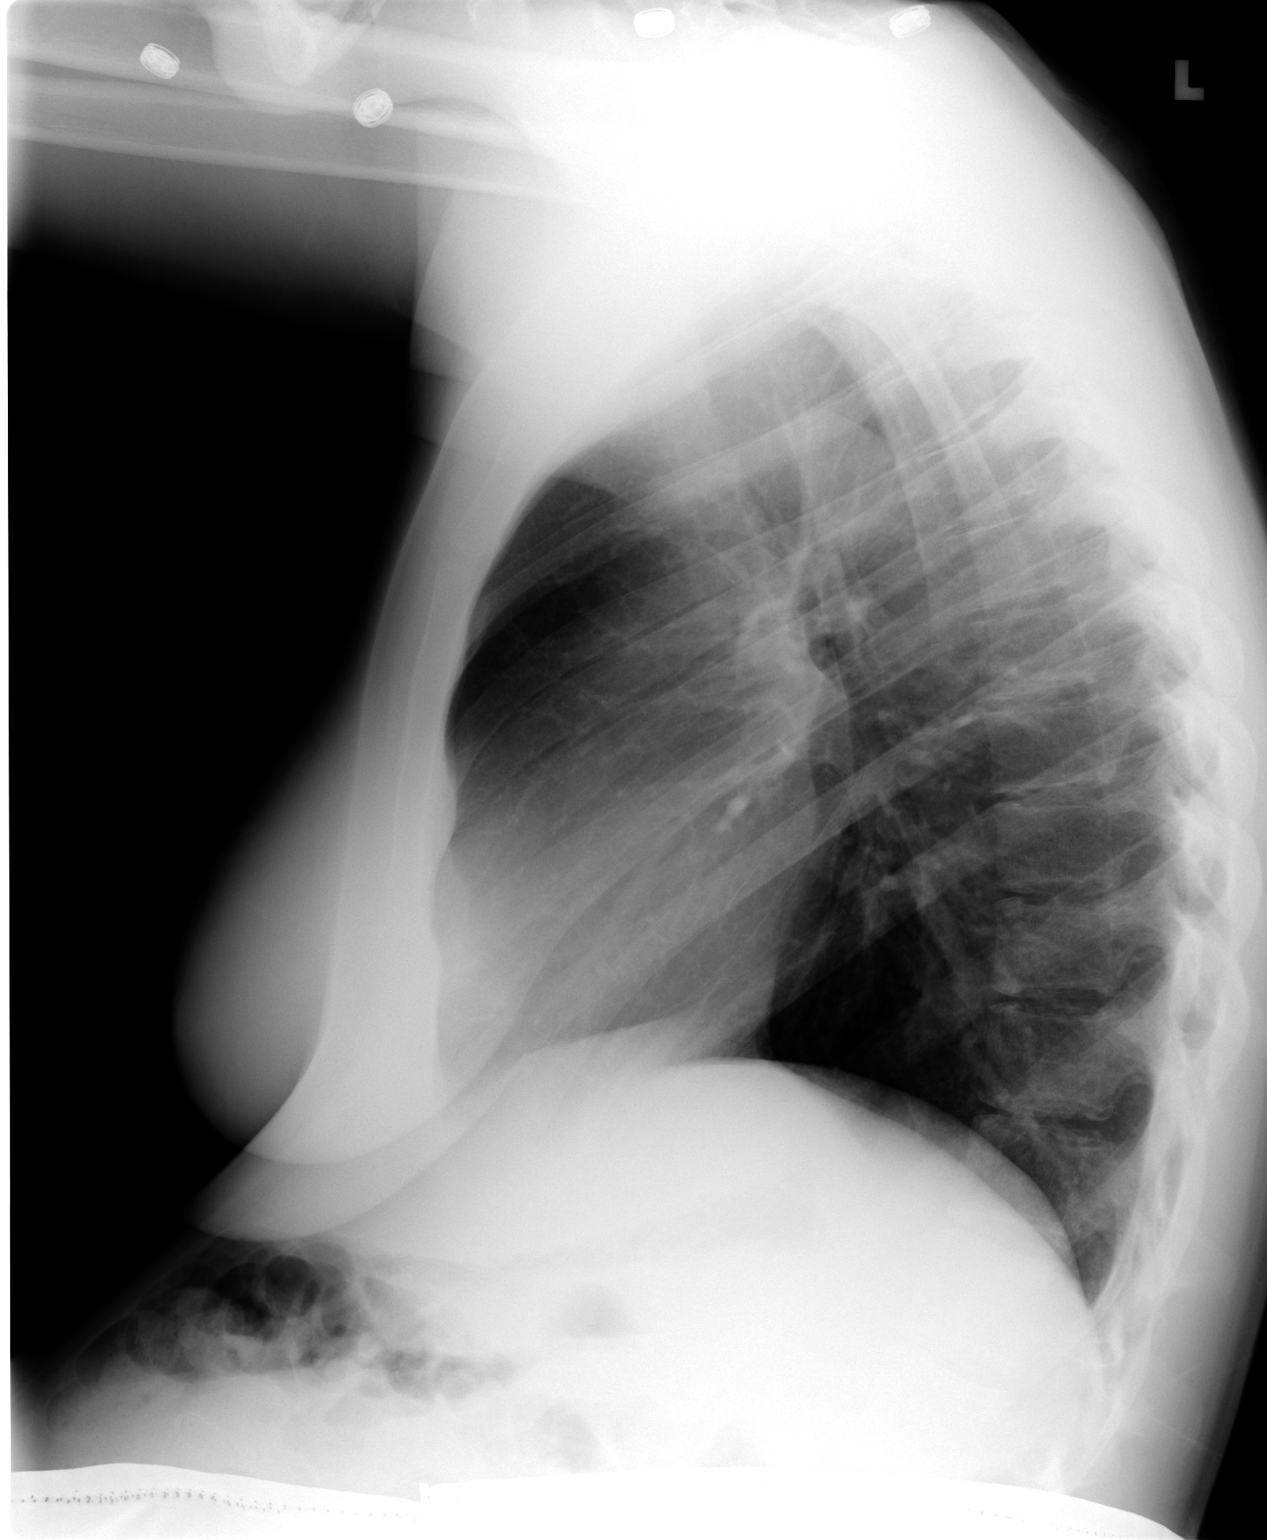

[2 of 2 positions shown; findings below may reference images not displayed]

FINDINGS: The abdomen was shielded. The heart size and mediastinal
contours are normal. The lungs are clear. There is no pleural
effusion or pneumothorax. No acute osseous findings are identified.
IMPRESSION: No active cardiopulmonary process.

## 2014-06-23 ENCOUNTER — Telehealth: Payer: Self-pay | Admitting: *Deleted

## 2014-06-28 NOTE — Telephone Encounter (Signed)
error 

## 2014-07-07 ENCOUNTER — Encounter: Payer: Medicaid Other | Admitting: Obstetrics

## 2014-07-08 ENCOUNTER — Encounter: Payer: Medicaid Other | Admitting: Obstetrics

## 2014-08-11 IMAGING — US US PELVIS COMPLETE
1 series · 13 of 25 positions shown · non-contrast
Comparison: CT abdomen and pelvis 10/22/2008

CLINICAL DATA: Right lower quadrant pelvic pain.

TRANSABDOMINAL AND TRANSVAGINAL ULTRASOUND OF PELVIS
DOPPLER ULTRASOUND OF OVARIES
TECHNIQUE: Both transabdominal and transvaginal ultrasound
examinations of the pelvis were performed. Transabdominal technique
was performed for global imaging of the pelvis including uterus,
ovaries, adnexal regions, and pelvic cul-de-sac.
It was necessary to proceed with endovaginal exam following the
transabdominal exam to visualize the ovaries and endometrium..
Color and duplex Doppler ultrasound was utilized to evaluate blood
flow to the ovaries.

[Series 1: us pelvis complete · 0.30mm/px · 13 of 69 slices shown]
[im 1/69]
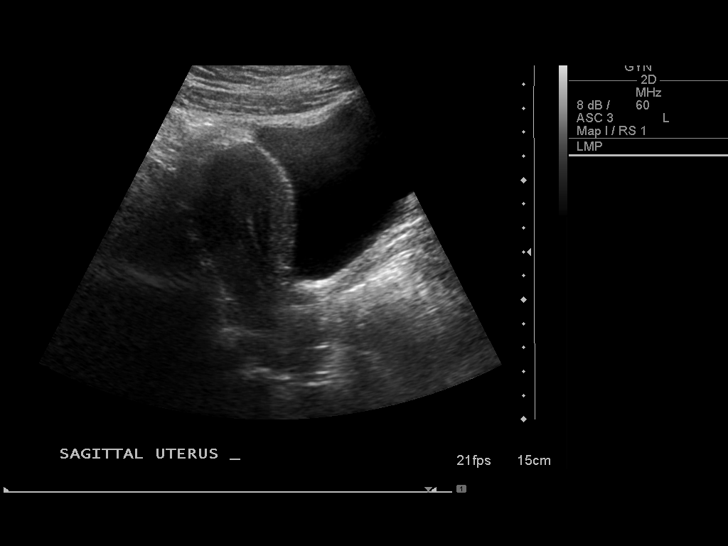
[im 6/69]
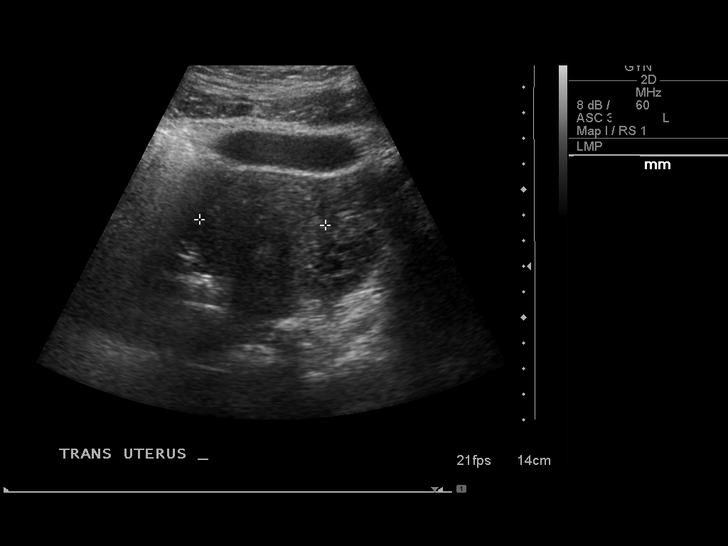
[im 12/69]
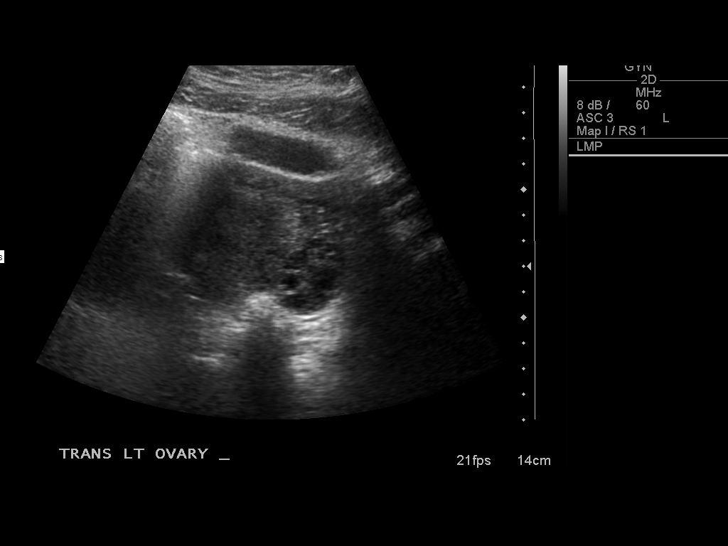
[im 18/69]
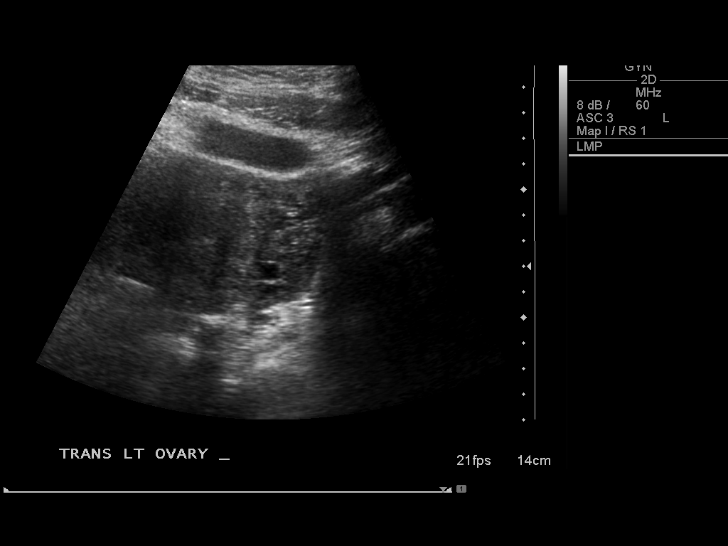
[im 23/69]
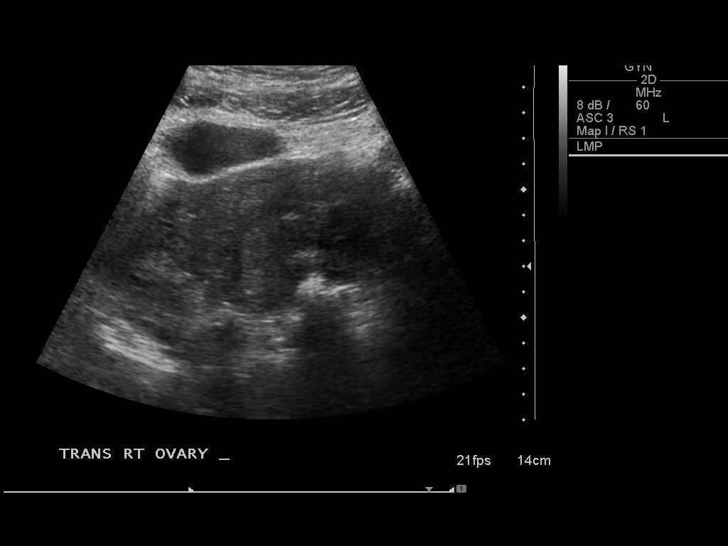
[im 29/69]
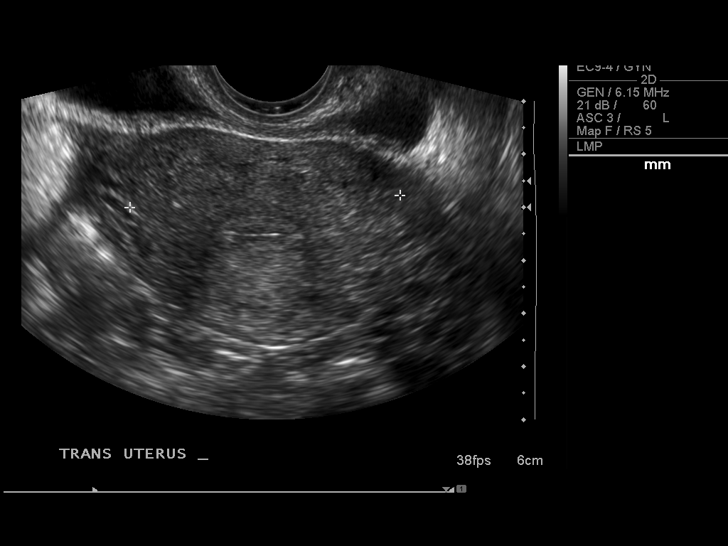
[im 35/69]
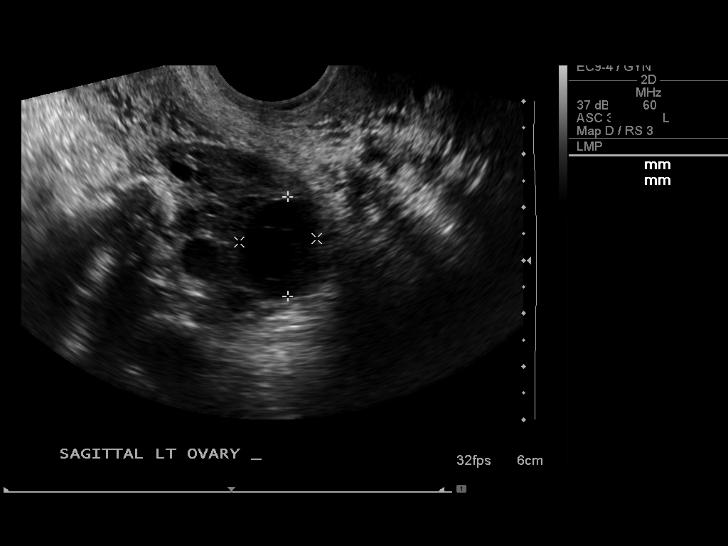
[im 40/69]
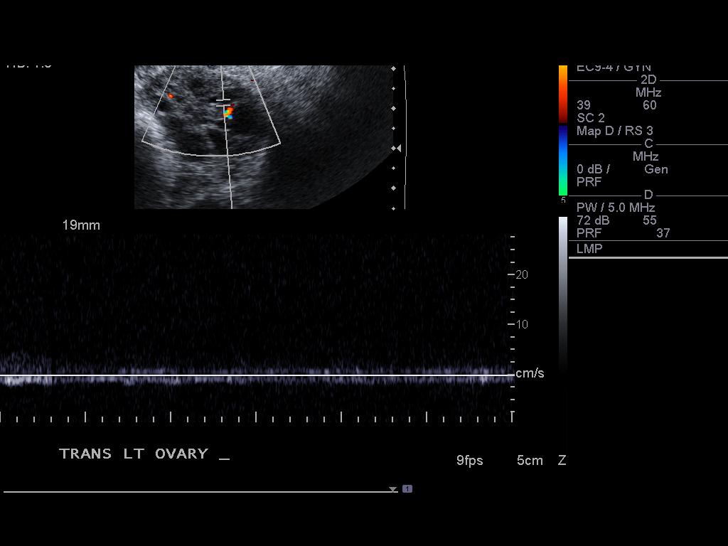
[im 46/69]
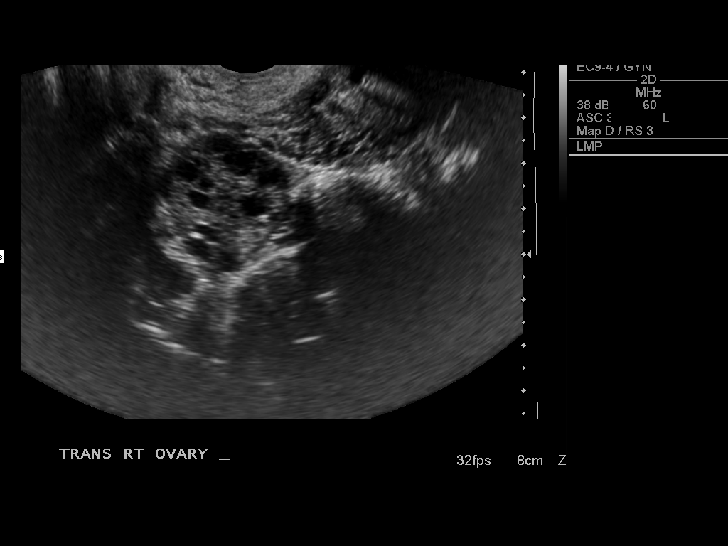
[im 52/69]
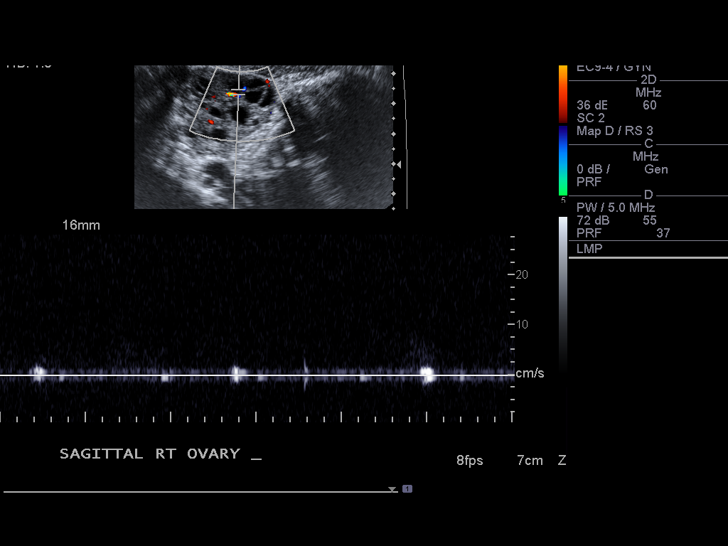
[im 57/69]
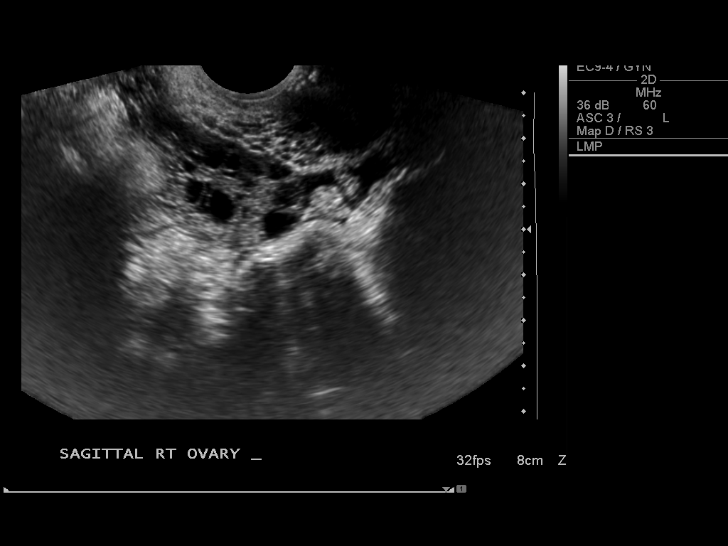
[im 63/69]
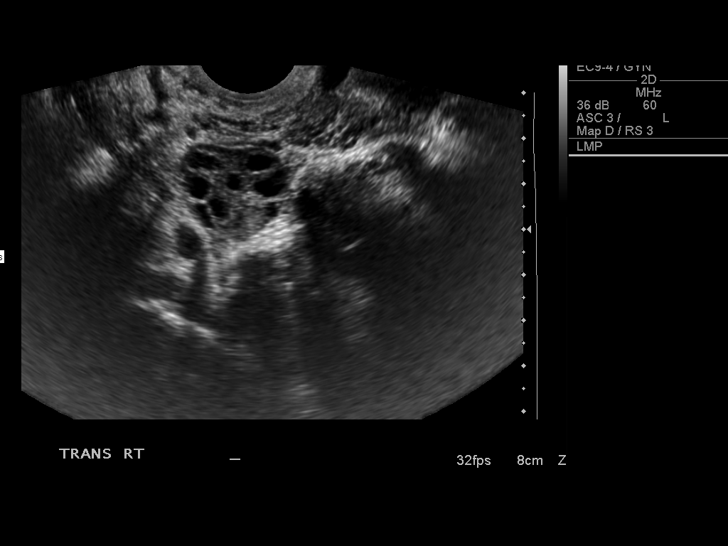
[im 69/69]
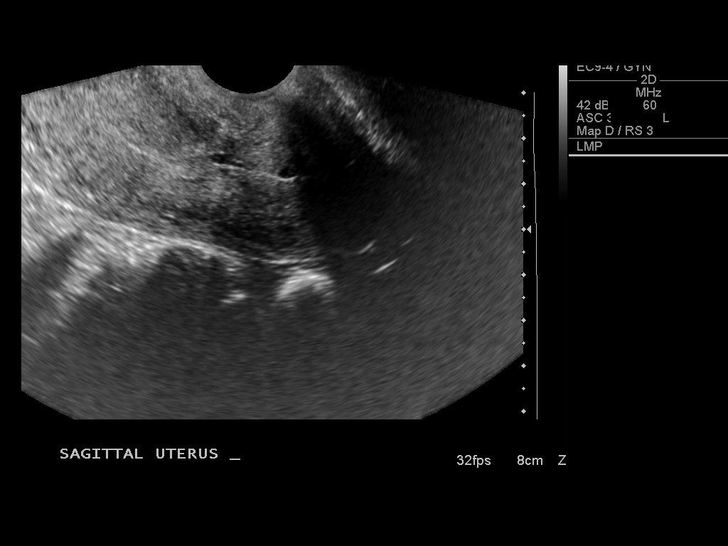

[13 of 25 positions shown; findings below may reference images not displayed]

FINDINGS

Uterus:  The uterus is anteverted and measures 7.6 x 4.1 x 5.1 cm.
No focal myometrial mass lesions identified.  Small Nabothian cysts
in the cervix.

Endometrium:  Endometrial stripe thickness is normal at 5.4 mm.

Right ovary: Right ovary measures 4.4 x 3 x 2.8 cm.  Normal
follicular changes are demonstrated.  Flow is demonstrated in the
right ovary on color flow Doppler imaging.  No abnormal adnexal
masses.

Left ovary: The left ovary measures 3.6 x 2.7 x 3.3 cm.  Normal
follicular changes are demonstrated.  There is a complex cyst with
minimal internal septations and debris measuring about 1.9 cm
maximal diameter.  This is likely a hemorrhagic cyst.  Flow is
demonstrated in the left ovary on color flow Doppler imaging.  No
abnormal adnexal masses.

Minimal free fluid in the pelvis.

Pulsed Doppler evaluation demonstrates normal low-resistance
arterial and venous waveforms in both ovaries.
IMPRESSION: Normal exam.  No evidence of pelvic mass or other significant
abnormality.

No sonographic evidence for ovarian torsion.

## 2014-08-20 ENCOUNTER — Encounter (HOSPITAL_COMMUNITY): Payer: Self-pay | Admitting: *Deleted

## 2014-08-20 ENCOUNTER — Inpatient Hospital Stay (HOSPITAL_COMMUNITY)
Admission: AD | Admit: 2014-08-20 | Discharge: 2014-08-20 | Discharge status: Against Medical Advice | Payer: Medicaid Other | Source: Ambulatory Visit | Attending: Obstetrics and Gynecology | Admitting: Obstetrics and Gynecology

## 2014-08-20 DIAGNOSIS — O9989 Other specified diseases and conditions complicating pregnancy, childbirth and the puerperium: Principal | ICD-10-CM

## 2014-08-20 DIAGNOSIS — O99891 Other specified diseases and conditions complicating pregnancy: Secondary | ICD-10-CM | POA: Diagnosis not present

## 2014-08-20 DIAGNOSIS — G43909 Migraine, unspecified, not intractable, without status migrainosus: Secondary | ICD-10-CM | POA: Diagnosis present

## 2014-08-20 NOTE — MAU Provider Note (Signed)
Gloria Valdez is a 23 y.o. G4P1021 at 13.6 weeks presents to MAU unannounced c/o migraine HA - R side of head x 2 days, had vomiting this morning, blurred vision, dizziness. Spotting this a.m. Slight abd cramping.  She denies vb or lof.    Pt triage in the waiting room, waiting for a room in MAU   History     Patient Active Problem List   Diagnosis Date Noted  . General counseling for initiation of other contraceptive measures 07/27/2013  . Possible pregnancy, not confirmed 07/27/2013  . Intraoperative hemorrhage 10/11/2012  . Cesarean delivery, without mention of indication, delivered, with or without mention of antepartum condition 10/11/2012  . Arrest of descent, delivered, current hospitalization 10/11/2012    Chief Complaint  Patient presents with  . Headache  . Blurred Vision  . Dizziness   HPI  OB History   Grav Para Term Preterm Abortions TAB SAB Ect Mult Living   0 2 0 2 0 0 1      Past Medical History  Diagnosis Date  . Asthma   . IBS (irritable bowel syndrome)   . Endometriosis     Past Surgical History  Procedure Laterality Date  . Tonsillectomy    . First rib removal    . Cesarean section  10/10/2012    Procedure: CESAREAN SECTION;  Surgeon: Kathreen Cosier, MD;  Location: [facility];  Service: Obstetrics;  Laterality: N/A;  Primary Cesarean Section Delivery Boy @ [phone], Apgars 6/9    Family History  Problem Relation Age of Onset  . Other Neg Hx   . Diabetes Mother   . Hypertension Father   . Asthma Father     History  Substance Use Topics  . Smoking status: Never Smoker   . Smokeless tobacco: Never Used  . Alcohol Use: Yes     Comment: socially    Allergies:  Allergies  Allergen Reactions  . Bee Venom Anaphylaxis  . Cinnamon Hives, Itching and Swelling  . Strawberry Itching and Swelling  . Demerol [Meperidine] Itching  . Phenergan [Promethazine Hcl] Nausea Only    Prescriptions prior to admission  Medication Sig Dispense  Refill  . cephALEXin (KEFLEX) 500 MG capsule Take 1 capsule (500 mg total) by mouth 4 (four) times daily.  40 capsule  0  . ibuprofen (ADVIL,MOTRIN) 800 MG tablet Take 1 tablet (800 mg total) by mouth 3 (three) times daily.  21 tablet  0  . norgestimate-ethinyl estradiol (ORTHO-CYCLEN,SPRINTEC,PREVIFEM) 0.25-35 MG-MCG tablet Take 1 tablet by mouth daily.  1 Package  11  . Prenat-Fe Poly-Methfol-FA-DHA (VITAFOL ULTRA) 29-0.6-0.4-200 MG CAPS Take 1 capsule by mouth daily before breakfast.  30 capsule  11    ROS See HPI above, all other systems are negative  Physical Exam   Blood pressure 107/72, pulse 82, temperature 98.2 F (36.8 C), temperature source Oral, resp. rate 16, height  (1.753 m), weight 149 lb 6.4 oz (67.767 kg), not currently breastfeeding.  Physical Exam  Ext:  ABD:  SVE:   ED Course  Assessment: IUP at  13.6 weeks Membranes: intact FHR:   CTX:      Plan:  Waiting for pt to be brought in for evaluation  Essie Lagunes, CNM, MSN 08/20/2014. 2:49 PM   Addendum: Pt not in the waiting room when called.  As of 6:20pm the pt has not returned for evaluation.  She has been removed from the census ans coded as leaving AMA

## 2014-08-20 NOTE — MAU Note (Signed)
Pt called by L. Paschal RN, not in lobby.

## 2014-08-20 NOTE — MAU Note (Signed)
Pt called, not in lobby.  Did not inform staff she was leaving. 

## 2014-08-20 NOTE — MAU Note (Signed)
Pt called, not in lobby 

## 2014-08-20 NOTE — MAU Note (Signed)
No pt in lobby when called to bring back to room.

## 2014-08-20 NOTE — MAU Note (Signed)
Migraine HA - R side of head x 2 days, had vomiting this morning, blurred vision, dizziness.  Spotting this a.m.  Slight abd cramping.

## 2014-10-25 ENCOUNTER — Encounter (HOSPITAL_COMMUNITY): Payer: Self-pay | Admitting: *Deleted

## 2015-06-25 ENCOUNTER — Encounter (HOSPITAL_COMMUNITY): Payer: Self-pay | Admitting: *Deleted

## 2017-08-17 ENCOUNTER — Emergency Department
Admission: EM | Admit: 2017-08-17 | Discharge: 2017-08-17 | Disposition: A | Discharge status: Home / Self Care | Payer: Self-pay | Attending: Emergency Medicine | Admitting: Emergency Medicine

## 2017-08-17 ENCOUNTER — Encounter: Payer: Self-pay | Admitting: Emergency Medicine

## 2017-08-17 ENCOUNTER — Emergency Department: Payer: Self-pay

## 2017-08-17 DIAGNOSIS — R102 Pelvic and perineal pain: Secondary | ICD-10-CM | POA: Insufficient documentation

## 2017-08-17 DIAGNOSIS — R109 Unspecified abdominal pain: Secondary | ICD-10-CM

## 2017-08-17 DIAGNOSIS — J45909 Unspecified asthma, uncomplicated: Secondary | ICD-10-CM | POA: Insufficient documentation

## 2017-08-17 DIAGNOSIS — N939 Abnormal uterine and vaginal bleeding, unspecified: Secondary | ICD-10-CM | POA: Insufficient documentation

## 2017-08-17 LAB — URINALYSIS, COMPLETE (UACMP) WITH MICROSCOPIC
BACTERIA UA: NONE SEEN
Bilirubin Urine: NEGATIVE
GLUCOSE, UA: NEGATIVE mg/dL
KETONES UR: NEGATIVE mg/dL
Leukocytes, UA: NEGATIVE
NITRITE: NEGATIVE
PROTEIN: NEGATIVE mg/dL
Specific Gravity, Urine: 1.026 (ref 1.005–1.030)
pH: 5 (ref 5.0–8.0)

## 2017-08-17 LAB — COMPREHENSIVE METABOLIC PANEL
ALK PHOS: 44 U/L (ref 38–126)
ALT: 11 U/L — ABNORMAL LOW (ref 14–54)
ANION GAP: 6 (ref 5–15)
AST: 16 U/L (ref 15–41)
Albumin: 4.4 g/dL (ref 3.5–5.0)
BILIRUBIN TOTAL: 1.2 mg/dL (ref 0.3–1.2)
BUN: 15 mg/dL (ref 6–20)
CALCIUM: 9.6 mg/dL (ref 8.9–10.3)
CO2: 27 mmol/L (ref 22–32)
Chloride: 106 mmol/L (ref 101–111)
Creatinine, Ser: 0.89 mg/dL (ref 0.44–1.00)
GFR calc Af Amer: >60 mL/min (ref >60)
GFR calc non Af Amer: >60 mL/min (ref >60)
Glucose, Bld: 83 mg/dL (ref 65–99)
POTASSIUM: 3.9 mmol/L (ref 3.5–5.1)
Sodium: 139 mmol/L (ref 135–145)
TOTAL PROTEIN: 7 g/dL (ref 6.5–8.1)

## 2017-08-17 LAB — CBC
HEMATOCRIT: 41 % (ref 35.0–47.0)
HEMOGLOBIN: 14.4 g/dL (ref 12.0–16.0)
MCH: 30.6 pg (ref 26.0–34.0)
MCHC: 35 g/dL (ref 32.0–36.0)
MCV: 87.4 fL (ref 80.0–100.0)
Platelets: 187 10*3/uL (ref 150–440)
RBC: 4.69 MIL/uL (ref 3.80–5.20)
RDW: 12.6 % (ref 11.5–14.5)
WBC: 6.8 10*3/uL (ref 3.6–11.0)

## 2017-08-17 LAB — LIPASE, BLOOD: Lipase: 30 U/L (ref 11–51)

## 2017-08-17 LAB — HCG, QUANTITATIVE, PREGNANCY: hCG, Beta Chain, Quant, S: <1 m[IU]/mL (ref <5)

## 2017-08-17 LAB — ABO/RH: ABO/RH(D): A NEG

## 2017-08-17 LAB — POCT PREGNANCY, URINE: Preg Test, Ur: POSITIVE — AB

## 2017-08-17 MED ORDER — TRAMADOL HCL 50 MG PO TABS: 50.0000 mg | ORAL_TABLET | Freq: Four times a day (QID) | ORAL | 0 refills | Status: AC | PRN

## 2017-08-17 NOTE — ED Notes (Signed)
Unsuccessful IV start x3, LAC (1), RAC (2).  Asking Alivia to attempt.

## 2017-08-17 NOTE — ED Notes (Signed)
Pt r/f [place]

## 2017-08-17 NOTE — ED Triage Notes (Signed)
L lower abd pain began yesterday. History of ovarian cyst removal same side.

## 2017-08-17 NOTE — Discharge Instructions (Signed)
Please seek medical attention for any high fevers, chest pain, shortness of breath, change in behavior, persistent vomiting, bloody stool or any other new or concerning symptoms.  

## 2017-08-17 NOTE — ED Notes (Signed)
Patient @ [facility]

## 2017-08-17 NOTE — ED Provider Notes (Signed)
[facility] Emergency Department Provider Note   ____________________________________________   I have reviewed the triage vital signs and the nursing notes.   HISTORY  Chief Complaint Abdominal Pain   History limited by: Not Limited   HPI Gloria Valdez is a 26 y.o. female who presents to the emergency department today because of concerns for abdominal pain. It is located in the left lower pelvis. It started yesterday. Has been somewhat intermittent. This is described as cramping. It has been accompanied by vaginal bleeding. The vaginal bleeding started yesterday. She thought it was the start of her period. She did take a urine pregnancy test 2 days ago which was negative.  Past Medical History:  Diagnosis Date  . Asthma   . Endometriosis   . IBS (irritable bowel syndrome)     Patient Active Problem List   Diagnosis Date Noted  . General counseling for initiation of other contraceptive measures 07/27/2013  . Possible pregnancy, not confirmed 07/27/2013  . Intraoperative hemorrhage 10/11/2012  . Cesarean delivery, without mention of indication, delivered, with or without mention of antepartum condition 10/11/2012  . Arrest of descent, delivered, current hospitalization 10/11/2012    Past Surgical History:  Procedure Laterality Date  . CESAREAN SECTION  10/10/2012   Procedure: CESAREAN SECTION;  Surgeon: Kathreen Cosier, MD;  Location: [facility];  Service: Obstetrics;  Laterality: N/A;  Primary Cesarean Section Delivery Boy @ [phone], Apgars 6/9  . FIRST RIB REMOVAL    . OVARIAN CYST REMOVAL    . TONSILLECTOMY      Prior to Admission medications   Medication Sig Start Date End Date Taking? Authorizing Provider  cephALEXin (KEFLEX) 500 MG capsule Take 1 capsule (500 mg total) by mouth 4 (four) times daily. 07/01/13   Antony Madura, PA-C  ibuprofen (ADVIL,MOTRIN) 800 MG tablet Take 1 tablet (800 mg total) by mouth 3 (three) times daily. 07/01/13    Antony Madura, PA-C  norgestimate-ethinyl estradiol (ORTHO-CYCLEN,SPRINTEC,PREVIFEM) 0.25-35 MG-MCG tablet Take 1 tablet by mouth daily. 07/27/13   Brock Bad, MD  Prenat-Fe Poly-Methfol-FA-DHA (VITAFOL ULTRA) 29-0.6-0.4-200 MG CAPS Take 1 capsule by mouth daily before breakfast. 07/27/13   Brock Bad, MD    Allergies Bee venom; Cinnamon; Strawberry extract; Demerol [meperidine]; and Phenergan [promethazine hcl]  Family History  Problem Relation Age of Onset  . Diabetes Mother   . Hypertension Father   . Asthma Father   . Other Neg Hx     Social History Social History  Substance Use Topics  . Smoking status: Never Smoker  . Smokeless tobacco: Never Used  . Alcohol use Yes     Comment: socially    Review of Systems Constitutional: No fever/chills Eyes: No visual changes. ENT: No sore throat. Cardiovascular: Denies chest pain. Respiratory: Denies shortness of breath. Gastrointestinal: Positive for lower abdominal pain.  Genitourinary: Positive for vaginal bleeding.  Musculoskeletal: Negative for back pain. Skin: Negative for rash. Neurological: Negative for headaches, focal weakness or numbness.  ____________________________________________   PHYSICAL EXAM:  VITAL SIGNS: ED Triage Vitals  Enc Vitals Group     BP 08/17/17 1608 118/78     Pulse Rate 08/17/17 1608 89     Resp 08/17/17 1608 18     Temp 08/17/17 1608 98 F (36.7 C)     Temp Source 08/17/17 1608 Oral     SpO2 08/17/17 1608 98 %     Weight 08/17/17 1611 152 lb (68.9 kg)     Height 08/17/17 1611 5'  9" (1.753 m)     Head Circumference --      Peak Flow --      Pain Score 08/17/17 1607 10   Constitutional: Alert and oriented. Well appearing and in no distress. Eyes: Conjunctivae are normal.  ENT   Head: Normocephalic and atraumatic.   Nose: No congestion/rhinnorhea.   Mouth/Throat: Mucous membranes are moist.   Neck: No stridor. Hematological/Lymphatic/Immunilogical: No  cervical lymphadenopathy. Cardiovascular: Normal rate, regular rhythm.  No murmurs, rubs, or gallops.  Respiratory: Normal respiratory effort without tachypnea nor retractions. Breath sounds are clear and equal bilaterally. No wheezes/rales/rhonchi. Gastrointestinal: Positive for left lower abdominal pain.  Genitourinary: Deferred Musculoskeletal: Normal range of motion in all extremities. No lower extremity edema. Neurologic:  Normal speech and language. No gross focal neurologic deficits are appreciated.  Skin:  Skin is warm, dry and intact. No rash noted. Psychiatric: Mood and affect are normal. Speech and behavior are normal. Patient exhibits appropriate insight and judgment.  ____________________________________________    LABS (pertinent positives/negatives)  Labs Reviewed  COMPREHENSIVE METABOLIC PANEL - Abnormal; Notable for the following:       Result Value   ALT 11 (*)    All other components within normal limits  URINALYSIS, COMPLETE (UACMP) WITH MICROSCOPIC - Abnormal; Notable for the following:    Color, Urine YELLOW (*)    APPearance CLEAR (*)    Hgb urine dipstick MODERATE (*)    Squamous Epithelial / LPF 0-5 (*)    All other components within normal limits  POCT PREGNANCY, URINE - Abnormal; Notable for the following:    Preg Test, Ur POSITIVE (*)    All other components within normal limits  LIPASE, BLOOD  CBC  HCG, QUANTITATIVE, PREGNANCY  POC URINE PREG, ED  ABO/RH     ____________________________________________   EKG  None  ____________________________________________    RADIOLOGY  US IMPRESSION: Unremarkable pelvic ultrasound.  ____________________________________________   PROCEDURES  Procedures  ____________________________________________   INITIAL IMPRESSION / ASSESSMENT AND PLAN / ED COURSE  Pertinent labs & imaging results that were available during my care of the patient were reviewed by me and considered in my medical  decision making (see chart for details).  Patient presented to the emergency department today because of concerns for lower abdominal pain. The patient initially had a positive urine pregnancy test however serum beta hCG less than 1. Ultrasound was performed which did not show any signs of conception.Additionally visualize the ovary without concerning findings. Patient did not have a leukocytosis nor was she febrile. At this point do not feel CT scan is necessary. Discussed return precautions with patient.  ____________________________________________   FINAL CLINICAL IMPRESSION(S) / ED DIAGNOSES  Final diagnoses:  Pelvic pain  Abdominal pain, unspecified abdominal location  Vaginal bleeding     Note: This dictation was prepared with Dragon dictation. Any transcriptional errors that result from this process are unintentional     Phineas Semen, MD 08/17/17 [phone]

## 2019-03-04 IMAGING — US US TRANSVAGINAL NON-OB
1 series · 14 of 25 positions shown · non-contrast
Comparison: None

CLINICAL DATA: Pelvic pain for 1 day

EXAM:
TRANSABDOMINAL AND TRANSVAGINAL ULTRASOUND OF PELVIS
TECHNIQUE: Both transabdominal and transvaginal ultrasound examinations of the
pelvis were performed. Transabdominal technique was performed for
global imaging of the pelvis including uterus, ovaries, adnexal
regions, and pelvic cul-de-sac. It was necessary to proceed with
endovaginal exam following the transabdominal exam to visualize the
uterus, endometrium, ovaries and adnexa .

[Series 1: us transvaginal non-ob · 0.20mm/px · 14 of 146 slices shown]
[im 1/146]
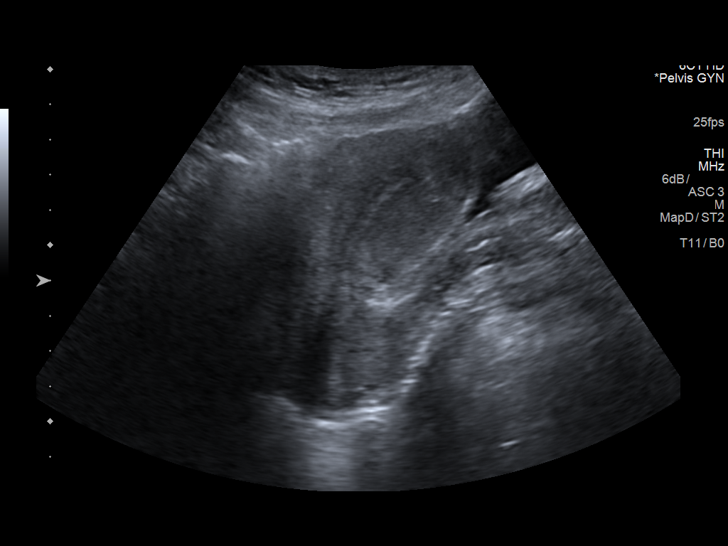
[im 13/146]
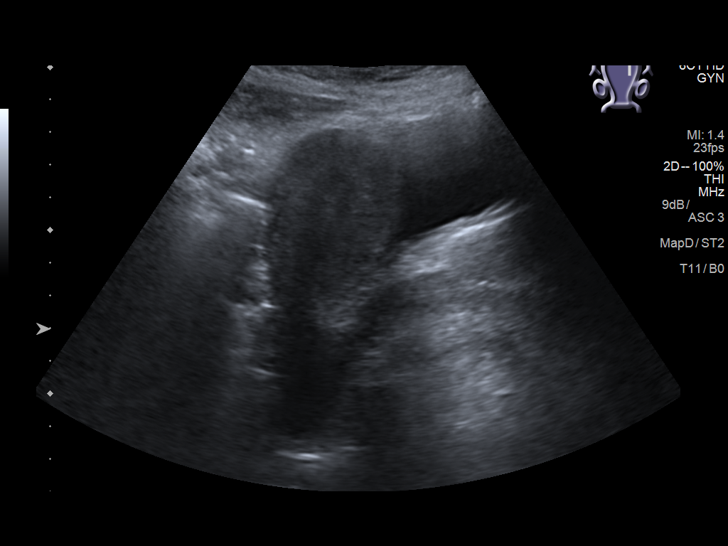
[im 25/146]
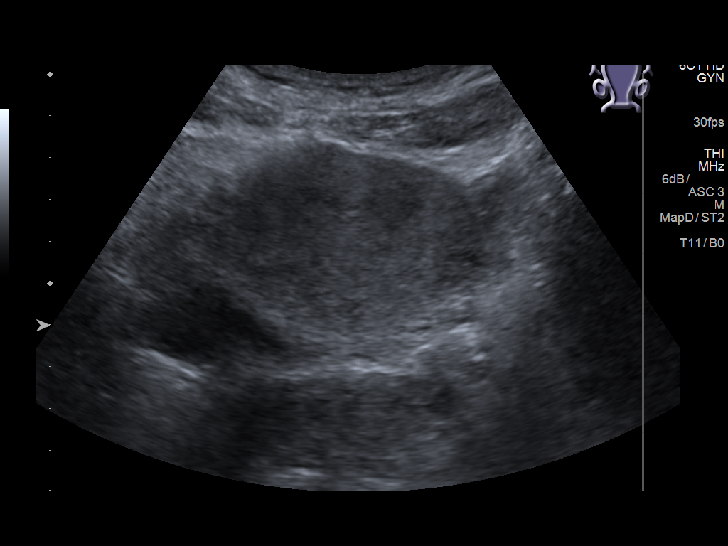
[im 37/146]
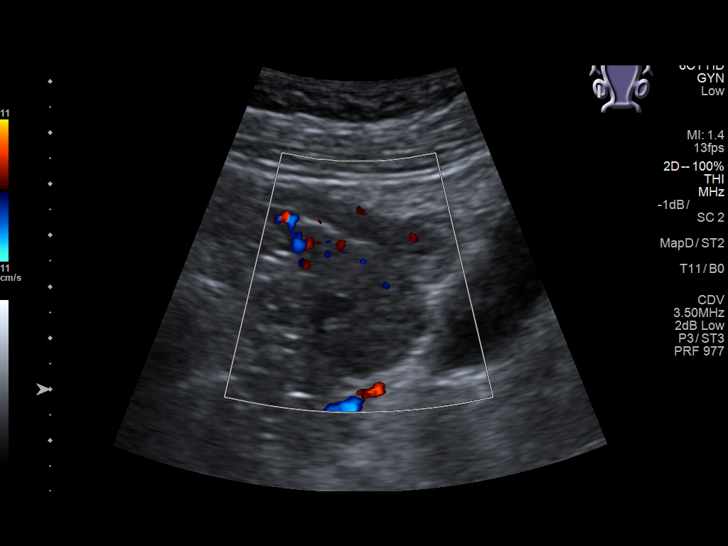
[im 49/146]
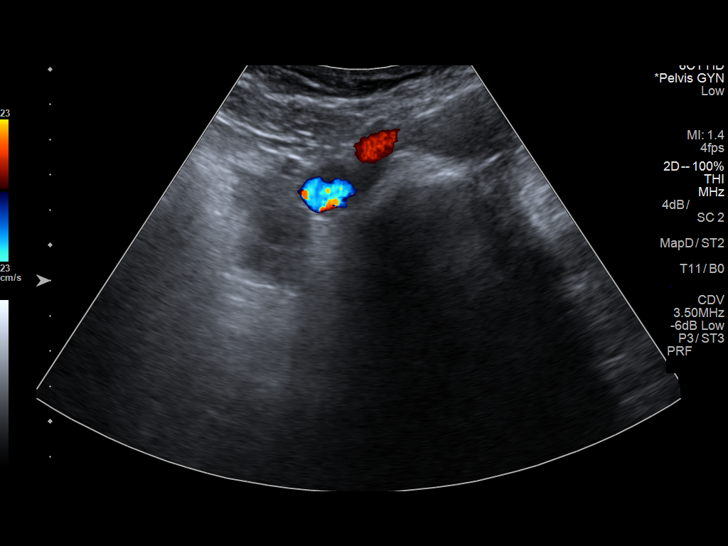
[im 55/146]
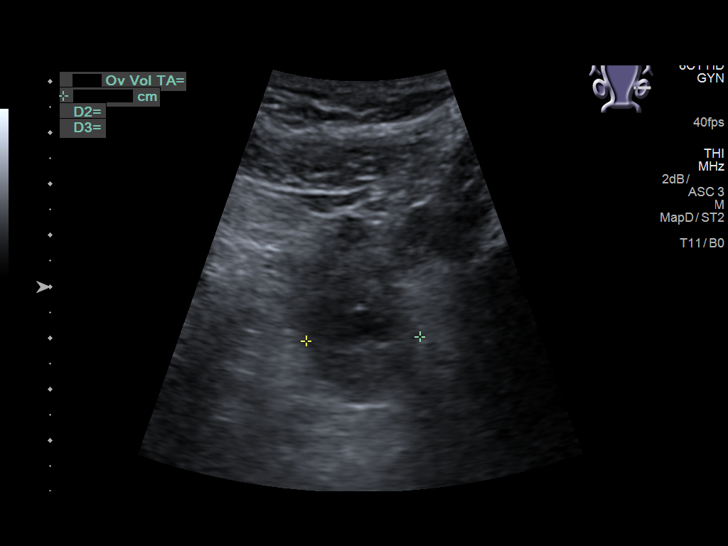
[im 67/146]
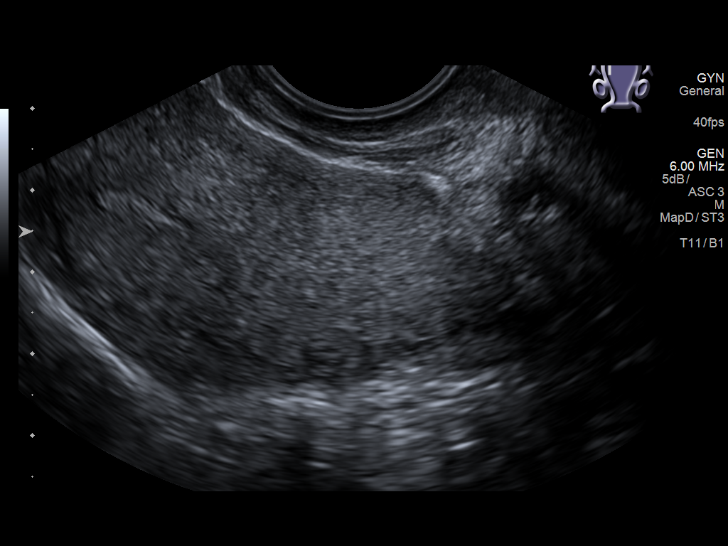
[im 79/146]
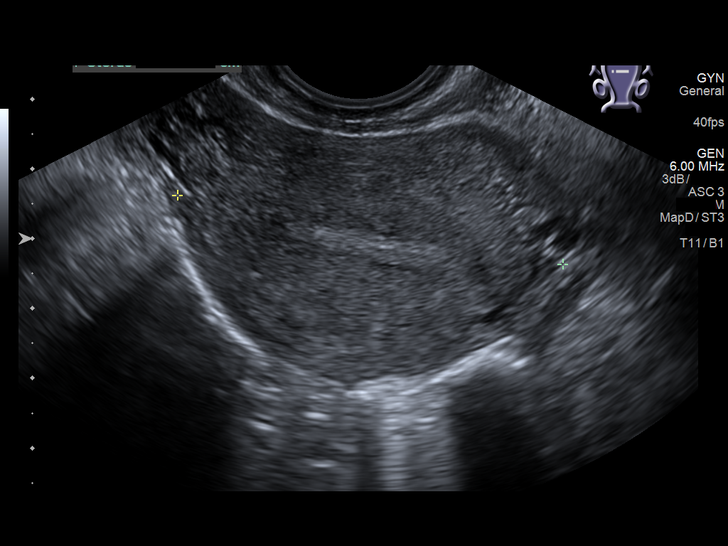
[im 91/146]
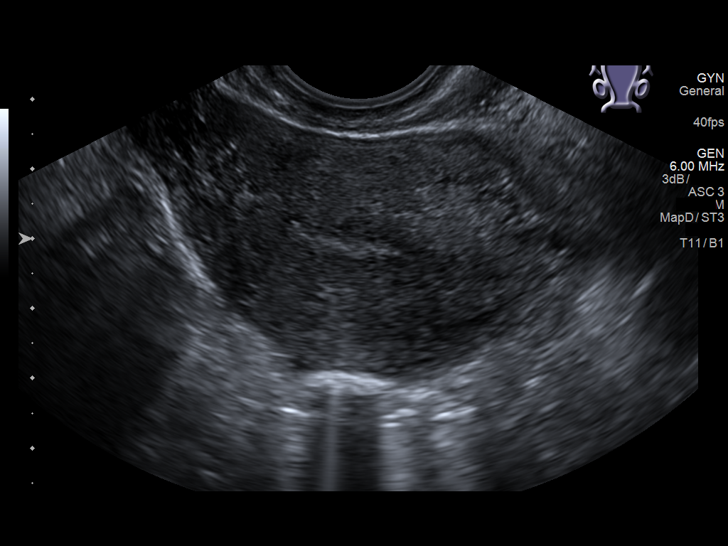
[im 97/146]
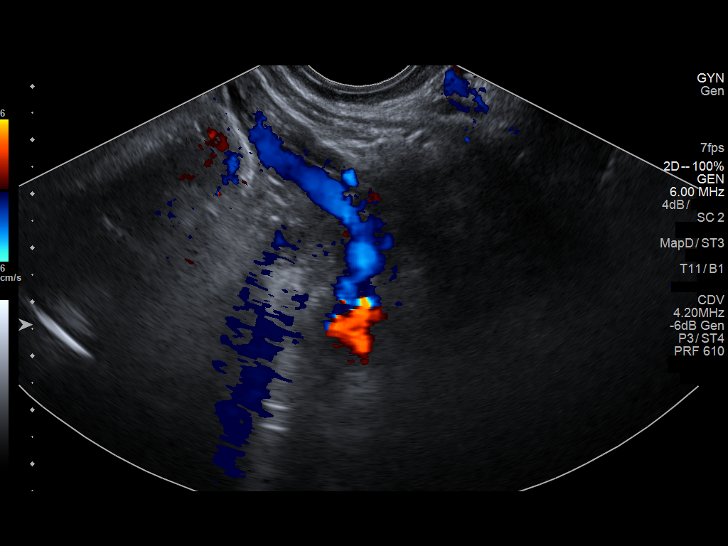
[im 109/146]
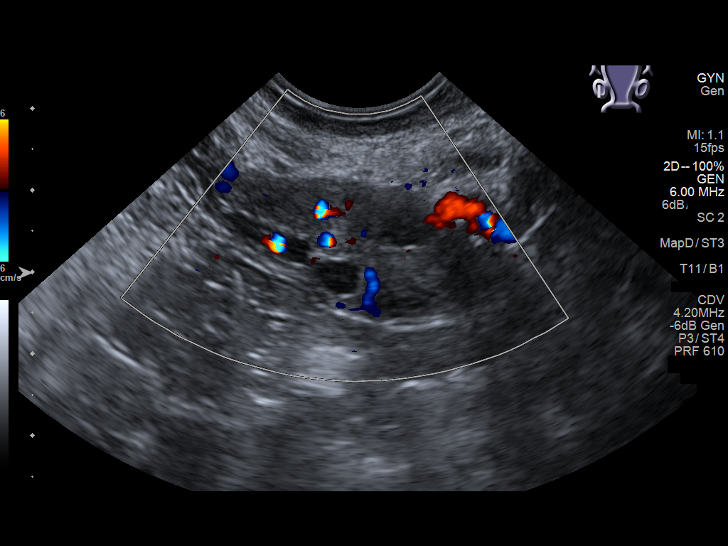
[im 121/146]
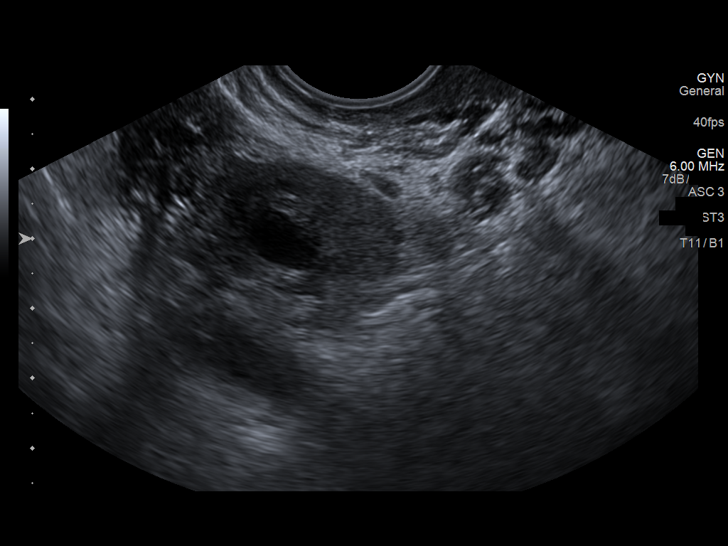
[im 133/146]
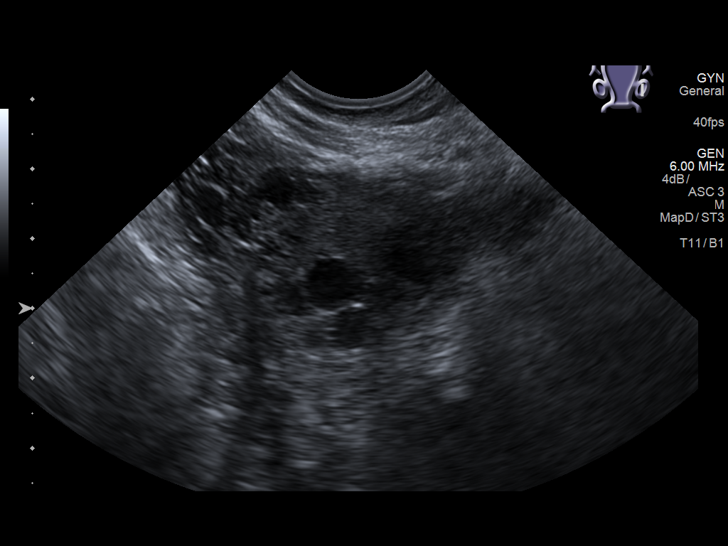
[im 146/146]
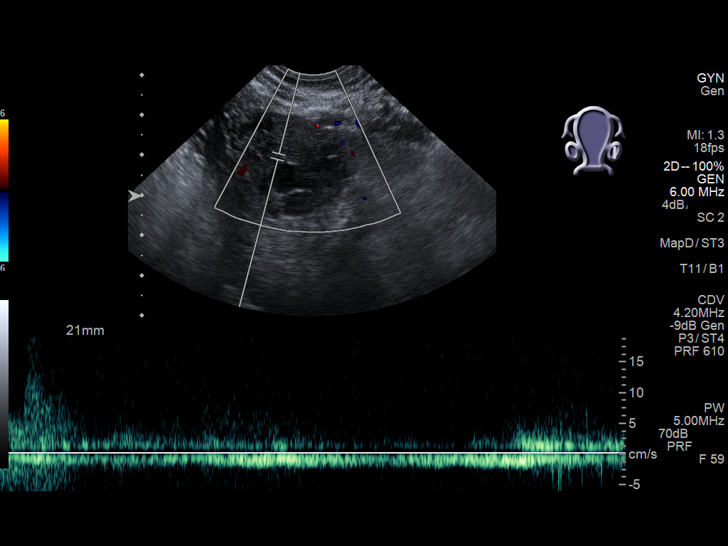

[14 of 25 positions shown; findings below may reference images not displayed]

FINDINGS: Uterus

Measurements: 9.2 x 3.7 x 5.6 cm. No fibroids or other mass
visualized.

Endometrium

Thickness: 4 mm in thickness.  No focal abnormality visualized.

Right ovary

Measurements: 3.1 x 1.9 x 3.0 cm. Normal appearance/no adnexal mass.

Left ovary

Measurements: 3.3 x 2.6 x 2.2 cm. Normal appearance/no adnexal mass.

Other findings

No abnormal free fluid.
IMPRESSION: Unremarkable pelvic ultrasound.
